# Patient Record
Sex: Male | Born: 1955 | Race: White | Hispanic: No | Marital: Single | Smoking: Former smoker
Health system: Southern US, Community
[De-identification: ages and names within clinical notes are randomized; demographics above are authoritative.]

## PROBLEM LIST (undated history)

## (undated) DIAGNOSIS — M545 Low back pain, unspecified: Secondary | ICD-10-CM

## (undated) DIAGNOSIS — M109 Gout, unspecified: Secondary | ICD-10-CM

## (undated) DIAGNOSIS — L819 Disorder of pigmentation, unspecified: Secondary | ICD-10-CM

## (undated) DIAGNOSIS — M1990 Unspecified osteoarthritis, unspecified site: Secondary | ICD-10-CM

## (undated) DIAGNOSIS — M519 Unspecified thoracic, thoracolumbar and lumbosacral intervertebral disc disorder: Secondary | ICD-10-CM

## (undated) DIAGNOSIS — G8929 Other chronic pain: Secondary | ICD-10-CM

## (undated) DIAGNOSIS — M5030 Other cervical disc degeneration, unspecified cervical region: Secondary | ICD-10-CM

## (undated) DIAGNOSIS — T8859XA Other complications of anesthesia, initial encounter: Secondary | ICD-10-CM

## (undated) DIAGNOSIS — T4145XA Adverse effect of unspecified anesthetic, initial encounter: Secondary | ICD-10-CM

## (undated) DIAGNOSIS — C4491 Basal cell carcinoma of skin, unspecified: Secondary | ICD-10-CM

## (undated) DIAGNOSIS — M5136 Other intervertebral disc degeneration, lumbar region: Secondary | ICD-10-CM

## (undated) DIAGNOSIS — M549 Dorsalgia, unspecified: Secondary | ICD-10-CM

## (undated) DIAGNOSIS — Z8619 Personal history of other infectious and parasitic diseases: Secondary | ICD-10-CM

## (undated) DIAGNOSIS — M51369 Other intervertebral disc degeneration, lumbar region without mention of lumbar back pain or lower extremity pain: Secondary | ICD-10-CM

## (undated) DIAGNOSIS — Z8489 Family history of other specified conditions: Secondary | ICD-10-CM

## (undated) DIAGNOSIS — J339 Nasal polyp, unspecified: Secondary | ICD-10-CM

## (undated) DIAGNOSIS — M169 Osteoarthritis of hip, unspecified: Secondary | ICD-10-CM

## (undated) HISTORY — PX: BACK SURGERY: SHX140

## (undated) HISTORY — PX: JOINT REPLACEMENT: SHX530

## (undated) HISTORY — PX: HERNIA REPAIR: SHX51

---

## 1989-08-21 HISTORY — PX: ANTERIOR CERVICAL DECOMP/DISCECTOMY FUSION: SHX1161

## 2002-08-21 HISTORY — PX: ABDOMINAL HERNIA REPAIR: SHX539

## 2016-06-27 ENCOUNTER — Ambulatory Visit (INDEPENDENT_AMBULATORY_CARE_PROVIDER_SITE_OTHER): Payer: BLUE CROSS/BLUE SHIELD | Admitting: Orthopaedic Surgery

## 2016-06-27 ENCOUNTER — Ambulatory Visit (INDEPENDENT_AMBULATORY_CARE_PROVIDER_SITE_OTHER): Payer: Self-pay

## 2016-06-27 DIAGNOSIS — M25552 Pain in left hip: Secondary | ICD-10-CM

## 2016-06-27 MED ORDER — DICLOFENAC SODIUM 75 MG PO TBEC: 75.0000 mg | DELAYED_RELEASE_TABLET | Freq: Two times a day (BID) | ORAL | 2 refills | Status: DC | PRN

## 2016-06-27 NOTE — Progress Notes (Signed)
   Office Visit Note   Patient: Danny Bender           Date of Birth: 1955-10-27           MRN: [id number] Visit Date: 06/27/2016              Requested by: [name], MD [address] [place], [place] [address] PCP: Henrine Screws, MD   Assessment & Plan: Visit Diagnoses:  1. Pain in left hip   2. Pain of left hip joint     Plan: I he was not inches any x-rays without trying medical treatment. He is only taken Aleve intermittently and on a regular basis. I'm Asendin some diclofenac to his pharmacy. I like to have him take this twice a day for at least a week followed by once a day for a week and then as needed. We'll see him back in about 3 or 4 weeks and see how he is doing overall. We still may not need x-rays lumbar spine and/or his hip and pelvis if he is not having any response to medications.  Follow-Up Instructions: No Follow-up on file.   Orders:  Orders Placed This Encounter  Procedures  . XR HIP UNILAT W OR W/O PELVIS 1V LEFT   Meds ordered this encounter  Medications  . diclofenac (VOLTAREN) 75 MG EC tablet    Sig: Take 1 tablet (75 mg total) by mouth 2 (two) times daily between meals as needed.    Dispense:  60 tablet    Refill:  2      Procedures: No procedures performed   Clinical Data: No additional findings.   Subjective: Chief Complaint  Patient presents with  . Left Hip - Pain    Around may he was cleaning out gutters going up and down ladder for a couple hours. Next day he had quite a bit of hip and groin pain.   He isn't having on and off intermittent pain in the groin for most of this year. He has tried anti-inflammatories which is just Aleve but not on a consistent basis at all. Dr. Geraldo Docker primary care physician did put him on 2 weeks of a steroid but this made him nauseous and quite sick. He is a very active individual who stands motion today. He is very flexible individual and as a regular exercise routine. There may  be a back component of his pain is well.  HPI  Review of Systems  Negative for chest pain, shortness of breath, fever, chills, nausea, vomiting Objective: Vital Signs: There were no vitals taken for this visit.  Physical Exam  Ortho Exam On examination he has a very lumbar lumbar spinal full flexion and extension. He has a negative straight leg raise bilaterally. He has some pain with internal/external rotation of his left hip. He is neurovascularly intact. Specialty Comments:  No specialty comments available.  Imaging: No results found. The patient deferred any x-rays today.  PMFS History: There are no active problems to display for this patient.  No past medical history on file.  No family history on file.  No past surgical history on file. Social History   Occupational History  . Not on file.   Social History Main Topics  . Smoking status: Not on file  . Smokeless tobacco: Not on file  . Alcohol use Not on file  . Drug use: Unknown  . Sexual activity: Not on file

## 2016-07-20 ENCOUNTER — Ambulatory Visit (INDEPENDENT_AMBULATORY_CARE_PROVIDER_SITE_OTHER): Payer: BLUE CROSS/BLUE SHIELD | Admitting: Orthopaedic Surgery

## 2016-07-20 DIAGNOSIS — M25552 Pain in left hip: Secondary | ICD-10-CM | POA: Diagnosis not present

## 2016-07-20 NOTE — Progress Notes (Signed)
The patient is doing much better after being started on anti-inflammatories. He still has some stiffness of his left hip but not the point where he wants anything done with it. I've had him on diclofenac twice a day.  On examination his left hip is considerably stiff limitations of internal rotation rotation as his right hip but isn't have a lot of pain with this at all.  At this point he'll follow up as needed. My next step with him would definitely be an AP pelvis and a lateral of his left hip if he comes back with hip pain. I do feel that he probably has significant arthritic hip but is not arm to the point of doing anything yet.

## 2016-10-18 ENCOUNTER — Other Ambulatory Visit (INDEPENDENT_AMBULATORY_CARE_PROVIDER_SITE_OTHER): Payer: Self-pay | Admitting: Orthopaedic Surgery

## 2016-10-20 ENCOUNTER — Other Ambulatory Visit (INDEPENDENT_AMBULATORY_CARE_PROVIDER_SITE_OTHER): Payer: Self-pay | Admitting: Orthopaedic Surgery

## 2017-04-18 ENCOUNTER — Telehealth (INDEPENDENT_AMBULATORY_CARE_PROVIDER_SITE_OTHER): Payer: Self-pay | Admitting: Orthopaedic Surgery

## 2017-04-18 NOTE — Telephone Encounter (Signed)
Do you know anything about this?? Or know anyone that would?

## 2017-04-18 NOTE — Telephone Encounter (Signed)
Patient called asked if Dr [name] is part of the [facility] Program. Patient said he was on the phone all day with Wayne. The number to contact patient is [phone]

## 2017-04-19 ENCOUNTER — Other Ambulatory Visit (INDEPENDENT_AMBULATORY_CARE_PROVIDER_SITE_OTHER): Payer: Self-pay | Admitting: Orthopaedic Surgery

## 2017-04-19 NOTE — Telephone Encounter (Signed)
See below

## 2017-04-19 NOTE — Telephone Encounter (Signed)
Forward to [employer].  She's familiar with this program.

## 2017-04-24 ENCOUNTER — Ambulatory Visit (INDEPENDENT_AMBULATORY_CARE_PROVIDER_SITE_OTHER): Payer: BLUE CROSS/BLUE SHIELD | Admitting: Orthopaedic Surgery

## 2017-04-24 ENCOUNTER — Ambulatory Visit (INDEPENDENT_AMBULATORY_CARE_PROVIDER_SITE_OTHER): Payer: BLUE CROSS/BLUE SHIELD

## 2017-04-24 DIAGNOSIS — M1612 Unilateral primary osteoarthritis, left hip: Secondary | ICD-10-CM | POA: Diagnosis not present

## 2017-04-24 DIAGNOSIS — M25552 Pain in left hip: Secondary | ICD-10-CM

## 2017-04-24 DIAGNOSIS — G8929 Other chronic pain: Secondary | ICD-10-CM | POA: Insufficient documentation

## 2017-04-24 NOTE — Progress Notes (Signed)
   Office Visit Note   Patient: Danny Bender           Date of Birth: 1955/12/20           MRN: [id number] Visit Date: 04/24/2017              Requested by: [name], MD [address] [place], [place] [address] PCP: [name], MD   Assessment & Plan: Visit Diagnoses:  1. Pain in left hip   2. Unilateral primary osteoarthritis, left hip     Plan: He does wish proceed with a total hip arthroplasty in December of this year. I agree with this at this point. He is tried and failed all forms conservative treatment over years now. His x-rays show significant severe osteophytes of the hip. With a long and thorough discussion about hip replacement surgery as well as a detailed discussion about the risk and benefits of the surgery. I talked about his intraoperative and postoperative course. I gave him a handout on hip replacement surgery and we went over his x-rays in detail as well as model showing what the surgery involves. All questions were encouraged and answered.  Follow-Up Instructions: Return for 2 weeks post-op.   Orders:  Orders Placed This Encounter  Procedures  . XR HIP UNILAT W OR W/O PELVIS 1V LEFT   No orders of the defined types were placed in this encounter.     Procedures: No procedures performed   Clinical Data: No additional findings.   Subjective: No chief complaint on file. The patient is someone I've seen for over a year now. He has severe debilitating arthritis of his left hip and is been well documented. He is tried over a year's worth of conservative treatment. His pain is daily and is detrimentally affected activities daily living, his quality life, and his mobility. At times it can be 10 out of 10 minutes all activity related. He said that hip is hurting in the groin and is significantly stiff at times.  HPI  Review of Systems   Objective: Vital Signs: There were no vitals taken for this visit.  Physical Exam He is alert  or 3 and in no acute distress. Ortho Exam His left hip is significantly stiff on exam with limited internal/external rotation. His very painful to move his hip around as well. His right hip exam is normal. Specialty Comments:  No specialty comments available.  Imaging: Xr Hip Unilat W Or W/o Pelvis 1v Left  Result Date: 04/24/2017 An AP pelvis and a lateral of his left hip show severe end-stage arthritis of left hip. There is complete loss of joint space. There are sclerotic changes in the acetabulum and the femoral head. There is also cystic changes in the femoral head and acetabulum. There periarticular osteophytes.    PMFS History: Patient Active Problem List   Diagnosis Date Noted  . Pain in left hip 04/24/2017  . Unilateral primary osteoarthritis, left hip 04/24/2017   No past medical history on file.  No family history on file.  No past surgical history on file. Social History   Occupational History  . Not on file.   Social History Main Topics  . Smoking status: Not on file  . Smokeless tobacco: Not on file  . Alcohol use Not on file  . Drug use: Unknown  . Sexual activity: Not on file

## 2017-04-24 NOTE — Progress Notes (Deleted)
Office Visit Note   Patient: Danny Bender           Date of Birth: 1956-02-25           MRN: [id number] Visit Date: 04/24/2017              Requested by: [name], MD [address] [place], [place] [address] PCP: [name], MD   Assessment & Plan: Visit Diagnoses:  1. Pain in left hip   2. Unilateral primary osteoarthritis, left hip     Plan: ***  Follow-Up Instructions: No Follow-up on file.   Orders:  Orders Placed This Encounter  Procedures  . XR HIP UNILAT W OR W/O PELVIS 1V LEFT   No orders of the defined types were placed in this encounter.     Procedures: No procedures performed   Clinical Data: No additional findings.   Subjective: No chief complaint on file.   HPI  Review of Systems   Objective: Vital Signs: There were no vitals taken for this visit.  Physical Exam  Ortho Exam  Specialty Comments:  No specialty comments available.  Imaging: No results found.   PMFS History: Patient Active Problem List   Diagnosis Date Noted  . Pain in left hip 04/24/2017  . Unilateral primary osteoarthritis, left hip 04/24/2017   No past medical history on file.  No family history on file.  No past surgical history on file. Social History   Occupational History  . Not on file.   Social History Main Topics  . Smoking status: Not on file  . Smokeless tobacco: Not on file  . Alcohol use Not on file  . Drug use: Unknown  . Sexual activity: Not on file

## 2017-05-31 NOTE — Telephone Encounter (Signed)
Spoke with patient and scheduled surgery. °

## 2017-07-22 ENCOUNTER — Other Ambulatory Visit (INDEPENDENT_AMBULATORY_CARE_PROVIDER_SITE_OTHER): Payer: Self-pay | Admitting: Orthopaedic Surgery

## 2017-07-25 ENCOUNTER — Other Ambulatory Visit (INDEPENDENT_AMBULATORY_CARE_PROVIDER_SITE_OTHER): Payer: Self-pay | Admitting: Physician Assistant

## 2017-08-03 ENCOUNTER — Encounter (HOSPITAL_COMMUNITY): Payer: Self-pay

## 2017-08-03 NOTE — Patient Instructions (Signed)
Danny Bender  08/03/2017   Your procedure is scheduled on: Friday, Dec. 21, 2018   Report to [facility] Main  Entrance    Report to admitting at 8:30 AM   Call this number if you have problems the morning of surgery [phone]   Do not eat food or drink liquids :After Midnight.     Take these medicines the morning of surgery with A SIP OF WATER: Use Flonase per normal routine                               You may not have any metal on your body including jewelry and body  piercings               Do not wear lotions, powders, perfumes, or deodorant                          Men may shave face and neck.   Do not bring valuables to the hospital. [place].   Contacts, dentures or bridgework may not be worn into surgery.   Leave suitcase in the car. After surgery it may be brought to your room.              Please read over the following fact sheets you were given: _____________________________________________________________________        [facility] - Preparing for Surgery Before surgery, you can play an important role.  Because skin is not sterile, your skin needs to be as free of germs as possible.  You can reduce the number of germs on your skin by washing with CHG (chlorahexidine gluconate) soap before surgery.  CHG is an antiseptic cleaner which kills germs and bonds with the skin to continue killing germs even after washing. Please DO NOT use if you have an allergy to CHG or antibacterial soaps.  If your skin becomes reddened/irritated stop using the CHG and inform your nurse when you arrive at Short Stay. Do not shave (including legs and underarms) for at least 48 hours prior to the first CHG shower.  You may shave your face/neck.  Please follow these instructions carefully:  1.  Shower with CHG Soap the night before surgery and the  morning of surgery.  2.  If you choose to wash your hair,  wash your hair first as usual with your normal  shampoo.  3.  After you shampoo, rinse your hair and body thoroughly to remove the shampoo.                             4.  Use CHG as you would any other liquid soap.  You can apply chg directly to the skin and wash.  Gently with a scrungie or clean washcloth.  5.  Apply the CHG Soap to your body ONLY FROM THE NECK DOWN.   Do   not use on face/ open                           Wound or open sores. Avoid contact with eyes, ears mouth and   genitals (private parts).  Wash face,  Genitals (private parts) with your normal soap.             6.  Wash thoroughly, paying special attention to the area where your    surgery  will be performed.  7.  Thoroughly rinse your body with warm water from the neck down.  8.  DO NOT shower/wash with your normal soap after using and rinsing off the CHG Soap.                9.  Pat yourself dry with a clean towel.            10.  Wear clean pajamas.            11.  Place clean sheets on your bed the night of your first shower and do not  sleep with pets. Day of Surgery : Do not apply any lotions/deodorants the morning of surgery.  Please wear clean clothes to the hospital/surgery center.  FAILURE TO FOLLOW THESE INSTRUCTIONS MAY RESULT IN THE CANCELLATION OF YOUR SURGERY  PATIENT SIGNATURE_________________________________  NURSE SIGNATURE__________________________________  ________________________________________________________________________   Danny Bender  An incentive spirometer is a tool that can help keep your lungs clear and active. This tool measures how well you are filling your lungs with each breath. Taking long deep breaths may help reverse or decrease the chance of developing breathing (pulmonary) problems (especially infection) following:  A long period of time when you are unable to move or be active. BEFORE THE PROCEDURE   If the spirometer includes an indicator to  show your best effort, your nurse or respiratory therapist will set it to a desired goal.  If possible, sit up straight or lean slightly forward. Try not to slouch.  Hold the incentive spirometer in an upright position. INSTRUCTIONS FOR USE  1. Sit on the edge of your bed if possible, or sit up as far as you can in bed or on a chair. 2. Hold the incentive spirometer in an upright position. 3. Breathe out normally. 4. Place the mouthpiece in your mouth and seal your lips tightly around it. 5. Breathe in slowly and as deeply as possible, raising the piston or the ball toward the top of the column. 6. Hold your breath for 3-5 seconds or for as long as possible. Allow the piston or ball to fall to the bottom of the column. 7. Remove the mouthpiece from your mouth and breathe out normally. 8. Rest for a few seconds and repeat Steps 1 through 7 at least 10 times every 1-2 hours when you are awake. Take your time and take a few normal breaths between deep breaths. 9. The spirometer may include an indicator to show your best effort. Use the indicator as a goal to work toward during each repetition. 10. After each set of 10 deep breaths, practice coughing to be sure your lungs are clear. If you have an incision (the cut made at the time of surgery), support your incision when coughing by placing a pillow or rolled up towels firmly against it. Once you are able to get out of bed, walk around indoors and cough well. You may stop using the incentive spirometer when instructed by your caregiver.  RISKS AND COMPLICATIONS  Take your time so you do not get dizzy or light-headed.  If you are in pain, you may need to take or ask for pain medication before doing incentive spirometry. It is harder to take a deep breath if you  are having pain. AFTER USE  Rest and breathe slowly and easily.  It can be helpful to keep track of a log of your progress. Your caregiver can provide you with a simple table to help with  this. If you are using the spirometer at home, follow these instructions: [place] IF:   You are having difficultly using the spirometer.  You have trouble using the spirometer as often as instructed.  Your pain medication is not giving enough relief while using the spirometer.  You develop fever of 100.5 F (38.1 C) or higher. SEEK IMMEDIATE MEDICAL CARE IF:   You cough up bloody sputum that had not been present before.  You develop fever of 102 F (38.9 C) or greater.  You develop worsening pain at or near the incision site. MAKE SURE YOU:   Understand these instructions.  Will watch your condition.  Will get help right away if you are not doing well or get worse. Document Released: 12/18/2006 Document Revised: 10/30/2011 Document Reviewed: 02/18/2007 ExitCare Patient Information 2014 ExitCare, [place].   ________________________________________________________________________  WHAT IS A BLOOD TRANSFUSION? Blood Transfusion Information  A transfusion is the replacement of blood or some of its parts. Blood is made up of multiple cells which provide different functions.  Red blood cells carry oxygen and are used for blood loss replacement.  White blood cells fight against infection.  Platelets control bleeding.  Plasma helps clot blood.  Other blood products are available for specialized needs, such as hemophilia or other clotting disorders. BEFORE THE TRANSFUSION  Who gives blood for transfusions?   Healthy volunteers who are fully evaluated to make sure their blood is safe. This is blood bank blood. Transfusion therapy is the safest it has ever been in the practice of medicine. Before blood is taken from a donor, a complete history is taken to make sure that person has no history of diseases nor engages in risky social behavior (examples are intravenous drug use or sexual activity with multiple partners). The donor's travel history is screened to minimize  risk of transmitting infections, such as malaria. The donated blood is tested for signs of infectious diseases, such as HIV and hepatitis. The blood is then tested to be sure it is compatible with you in order to minimize the chance of a transfusion reaction. If you or a relative donates blood, this is often done in anticipation of surgery and is not appropriate for emergency situations. It takes many days to process the donated blood. RISKS AND COMPLICATIONS Although transfusion therapy is very safe and saves many lives, the main dangers of transfusion include:   Getting an infectious disease.  Developing a transfusion reaction. This is an allergic reaction to something in the blood you were given. Every precaution is taken to prevent this. The decision to have a blood transfusion has been considered carefully by your caregiver before blood is given. Blood is not given unless the benefits outweigh the risks. AFTER THE TRANSFUSION  Right after receiving a blood transfusion, you will usually feel much better and more energetic. This is especially true if your red blood cells have gotten low (anemic). The transfusion raises the level of the red blood cells which carry oxygen, and this usually causes an energy increase.  The nurse administering the transfusion will monitor you carefully for complications. HOME CARE INSTRUCTIONS  No special instructions are needed after a transfusion. You may find your energy is better. Speak with your caregiver about any limitations on activity  for underlying diseases you may have. SEEK MEDICAL CARE IF:   Your condition is not improving after your transfusion.  You develop redness or irritation at the intravenous (IV) site. SEEK IMMEDIATE MEDICAL CARE IF:  Any of the following symptoms occur over the next 12 hours:  Shaking chills.  You have a temperature by mouth above 102 F (38.9 C), not controlled by medicine.  Chest, back, or muscle pain.  People  around you feel you are not acting correctly or are confused.  Shortness of breath or difficulty breathing.  Dizziness and fainting.  You get a rash or develop hives.  You have a decrease in urine output.  Your urine turns a dark color or changes to pink, red, or brown. Any of the following symptoms occur over the next 10 days:  You have a temperature by mouth above 102 F (38.9 C), not controlled by medicine.  Shortness of breath.  Weakness after normal activity.  The white part of the eye turns yellow (jaundice).  You have a decrease in the amount of urine or are urinating less often.  Your urine turns a dark color or changes to pink, red, or brown. Document Released: 08/04/2000 Document Revised: 10/30/2011 Document Reviewed: 03/23/2008 [facility] Patient Information 2014 [place], [place].  _______________________________________________________________________

## 2017-08-03 NOTE — Pre-Procedure Instructions (Signed)
Last office visit note from Dr. [name] 04/24/17 in epic.

## 2017-08-06 ENCOUNTER — Encounter (INDEPENDENT_AMBULATORY_CARE_PROVIDER_SITE_OTHER): Payer: Self-pay

## 2017-08-06 ENCOUNTER — Encounter (HOSPITAL_COMMUNITY): Payer: Self-pay

## 2017-08-06 ENCOUNTER — Other Ambulatory Visit: Payer: Self-pay

## 2017-08-06 ENCOUNTER — Encounter (HOSPITAL_COMMUNITY)
Admission: RE | Admit: 2017-08-06 | Discharge: 2017-08-06 | Disposition: A | Discharge status: Home / Self Care | Payer: BLUE CROSS/BLUE SHIELD | Source: Ambulatory Visit | Attending: Orthopaedic Surgery | Admitting: Orthopaedic Surgery

## 2017-08-06 DIAGNOSIS — M1612 Unilateral primary osteoarthritis, left hip: Secondary | ICD-10-CM | POA: Insufficient documentation

## 2017-08-06 DIAGNOSIS — Z01812 Encounter for preprocedural laboratory examination: Secondary | ICD-10-CM | POA: Insufficient documentation

## 2017-08-06 HISTORY — DX: Personal history of other infectious and parasitic diseases: Z86.19

## 2017-08-06 HISTORY — DX: Other cervical disc degeneration, unspecified cervical region: M50.30

## 2017-08-06 HISTORY — DX: Adverse effect of unspecified anesthetic, initial encounter: T41.45XA

## 2017-08-06 HISTORY — DX: Osteoarthritis of hip, unspecified: M16.9

## 2017-08-06 HISTORY — DX: Other chronic pain: G89.29

## 2017-08-06 HISTORY — DX: Other intervertebral disc degeneration, lumbar region: M51.36

## 2017-08-06 HISTORY — DX: Other intervertebral disc degeneration, lumbar region without mention of lumbar back pain or lower extremity pain: M51.369

## 2017-08-06 HISTORY — DX: Nasal polyp, unspecified: J33.9

## 2017-08-06 HISTORY — DX: Disorder of pigmentation, unspecified: L81.9

## 2017-08-06 HISTORY — DX: Unspecified thoracic, thoracolumbar and lumbosacral intervertebral disc disorder: M51.9

## 2017-08-06 HISTORY — DX: Other complications of anesthesia, initial encounter: T88.59XA

## 2017-08-06 HISTORY — DX: Dorsalgia, unspecified: M54.9

## 2017-08-06 LAB — CBC
HEMATOCRIT: 43.1 % (ref 39.0–52.0)
HEMOGLOBIN: 14.4 g/dL (ref 13.0–17.0)
MCH: 32.4 pg (ref 26.0–34.0)
MCHC: 33.4 g/dL (ref 30.0–36.0)
MCV: 97.1 fL (ref 78.0–100.0)
Platelets: 232 10*3/uL (ref 150–400)
RBC: 4.44 MIL/uL (ref 4.22–5.81)
RDW: 13 % (ref 11.5–15.5)
WBC: 5 10*3/uL (ref 4.0–10.5)

## 2017-08-06 LAB — SURGICAL PCR SCREEN
MRSA, PCR: NEGATIVE
STAPHYLOCOCCUS AUREUS: NEGATIVE

## 2017-08-07 LAB — ABO/RH: ABO/RH(D): O POS

## 2017-08-10 ENCOUNTER — Encounter (HOSPITAL_COMMUNITY)
Admission: RE | Disposition: A | Discharge status: Home Health | Payer: Self-pay | Source: Ambulatory Visit | Attending: Orthopaedic Surgery

## 2017-08-10 ENCOUNTER — Inpatient Hospital Stay (HOSPITAL_COMMUNITY): Payer: BLUE CROSS/BLUE SHIELD

## 2017-08-10 ENCOUNTER — Encounter (HOSPITAL_COMMUNITY): Payer: Self-pay | Admitting: *Deleted

## 2017-08-10 ENCOUNTER — Other Ambulatory Visit (INDEPENDENT_AMBULATORY_CARE_PROVIDER_SITE_OTHER): Payer: Self-pay

## 2017-08-10 ENCOUNTER — Inpatient Hospital Stay (HOSPITAL_COMMUNITY)
Admission: RE | Admit: 2017-08-10 | Discharge: 2017-08-11 | DRG: 470 | Disposition: A | Discharge status: Home Health | Payer: BLUE CROSS/BLUE SHIELD | Source: Ambulatory Visit | Attending: Orthopaedic Surgery | Admitting: Orthopaedic Surgery

## 2017-08-10 ENCOUNTER — Inpatient Hospital Stay (HOSPITAL_COMMUNITY): Payer: BLUE CROSS/BLUE SHIELD | Admitting: Anesthesiology

## 2017-08-10 DIAGNOSIS — M25552 Pain in left hip: Secondary | ICD-10-CM | POA: Diagnosis present

## 2017-08-10 DIAGNOSIS — Z85828 Personal history of other malignant neoplasm of skin: Secondary | ICD-10-CM

## 2017-08-10 DIAGNOSIS — Z981 Arthrodesis status: Secondary | ICD-10-CM

## 2017-08-10 DIAGNOSIS — Z419 Encounter for procedure for purposes other than remedying health state, unspecified: Secondary | ICD-10-CM

## 2017-08-10 DIAGNOSIS — Z96642 Presence of left artificial hip joint: Secondary | ICD-10-CM

## 2017-08-10 DIAGNOSIS — M1612 Unilateral primary osteoarthritis, left hip: Principal | ICD-10-CM | POA: Diagnosis present

## 2017-08-10 HISTORY — PX: TOTAL HIP ARTHROPLASTY: SHX124

## 2017-08-10 LAB — TYPE AND SCREEN
ABO/RH(D): O POS
Antibody Screen: NEGATIVE

## 2017-08-10 IMAGING — DX DG PORTABLE PELVIS
1 series · 1 of 1 positions shown · non-contrast
Comparison: Operative images obtained earlier today.

CLINICAL DATA: Status post left hip arthroplasty.

EXAM:
PORTABLE PELVIS 1-2 VIEWS

[pelvis ap]
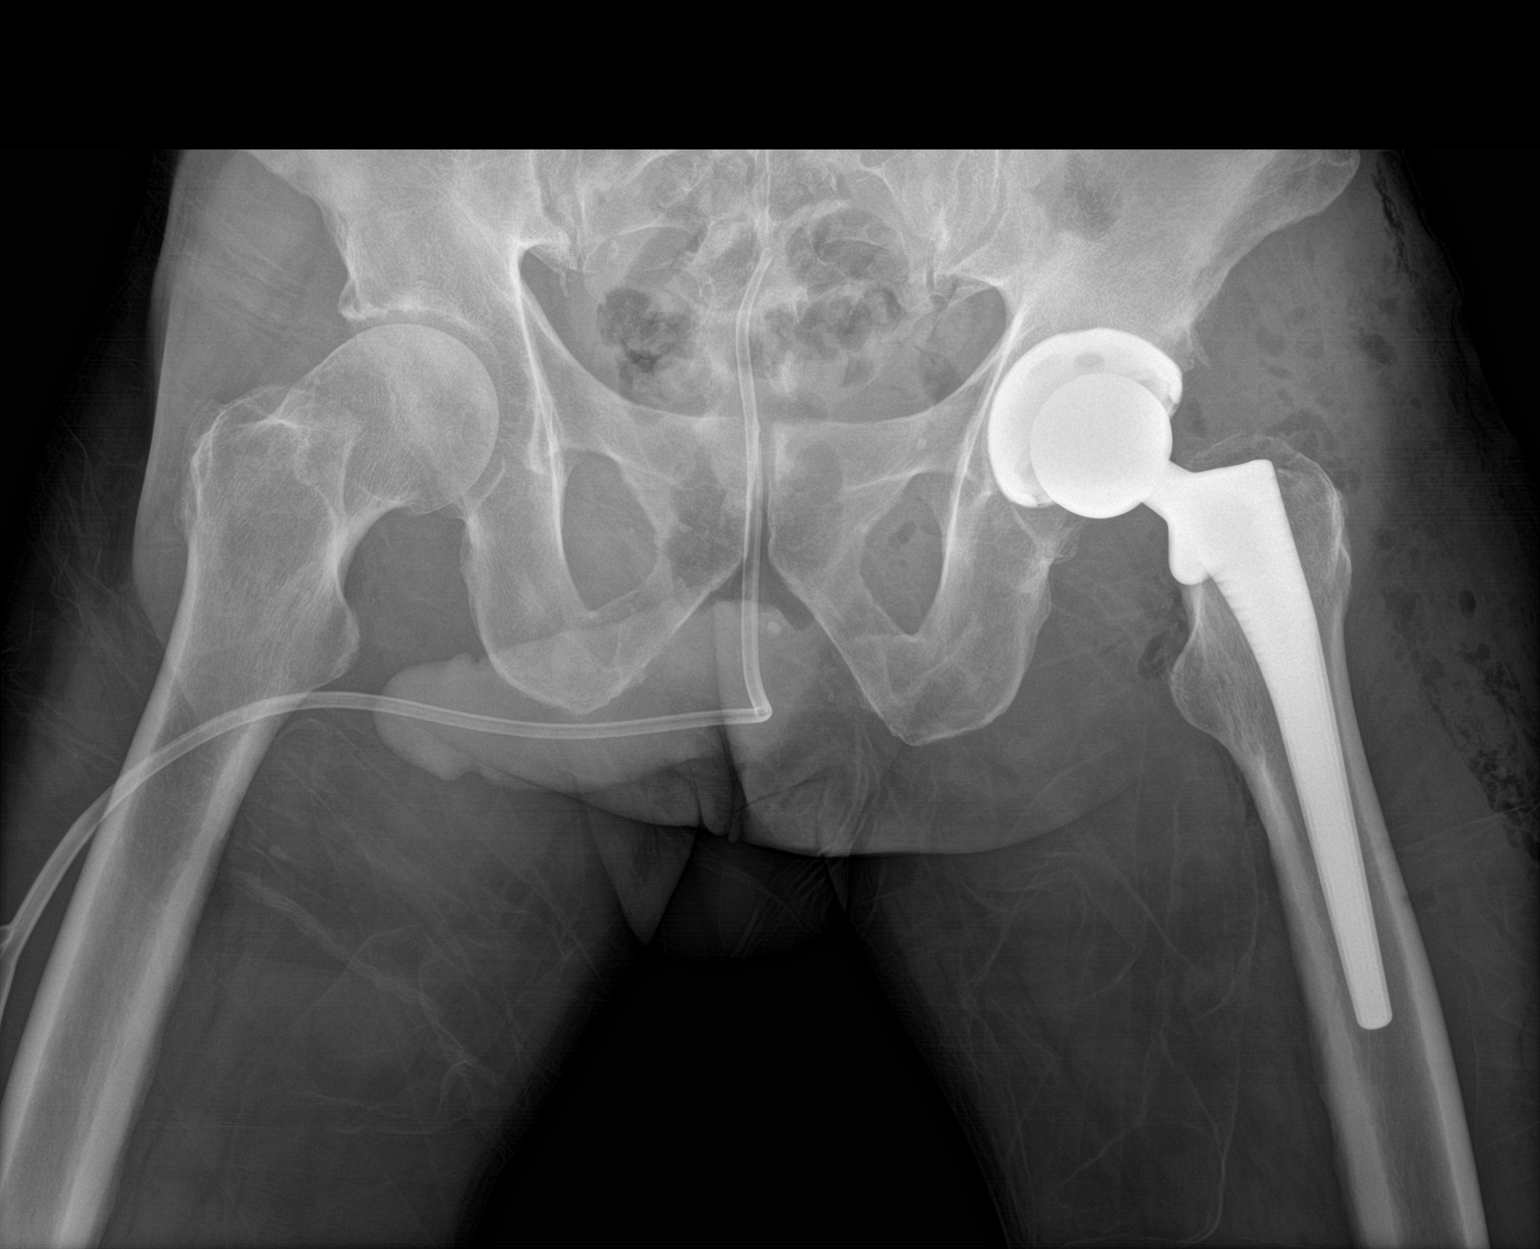

[1 of 1 positions shown; findings below may reference images not displayed]

FINDINGS: Total left hip arthroplasty components appear well seated and well
aligned on the single AP view. There is no acute fracture or
evidence of an operative complication.
IMPRESSION: Well-positioned left hip total arthroplasty.

## 2017-08-10 IMAGING — RF DG HIP (WITH PELVIS) OPERATIVE*L*
1 series · 2 of 2 positions shown · non-contrast
Comparison: None.

CLINICAL DATA: Intra op left side anterior approach total hip
replacement. Hx of OA.

EXAM:
OPERATIVE left HIP (WITH PELVIS IF PERFORMED) 2 VIEWS
TECHNIQUE: Fluoroscopic spot image(s) were submitted for interpretation
post-operatively.

[Series 1: run · 2 of 2 slices shown]
[im 1/2]
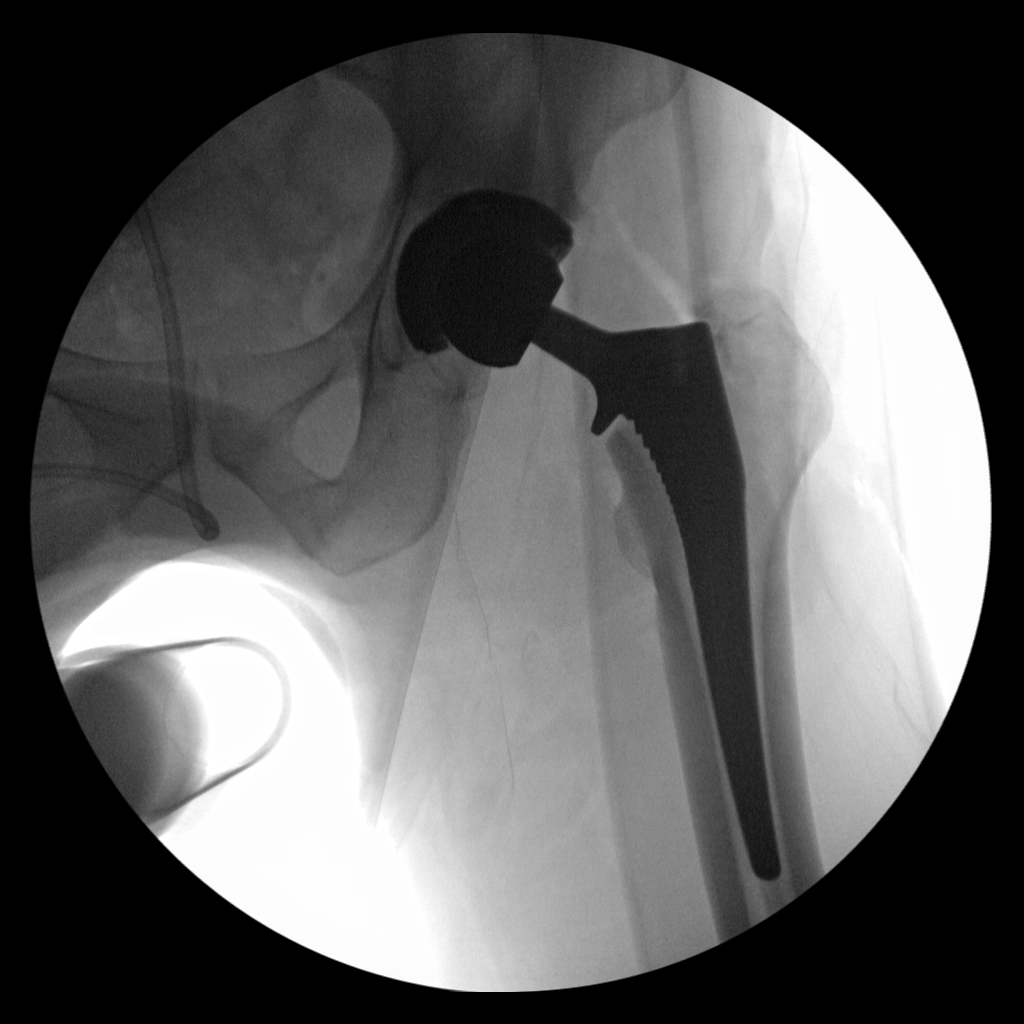
[im 2/2]
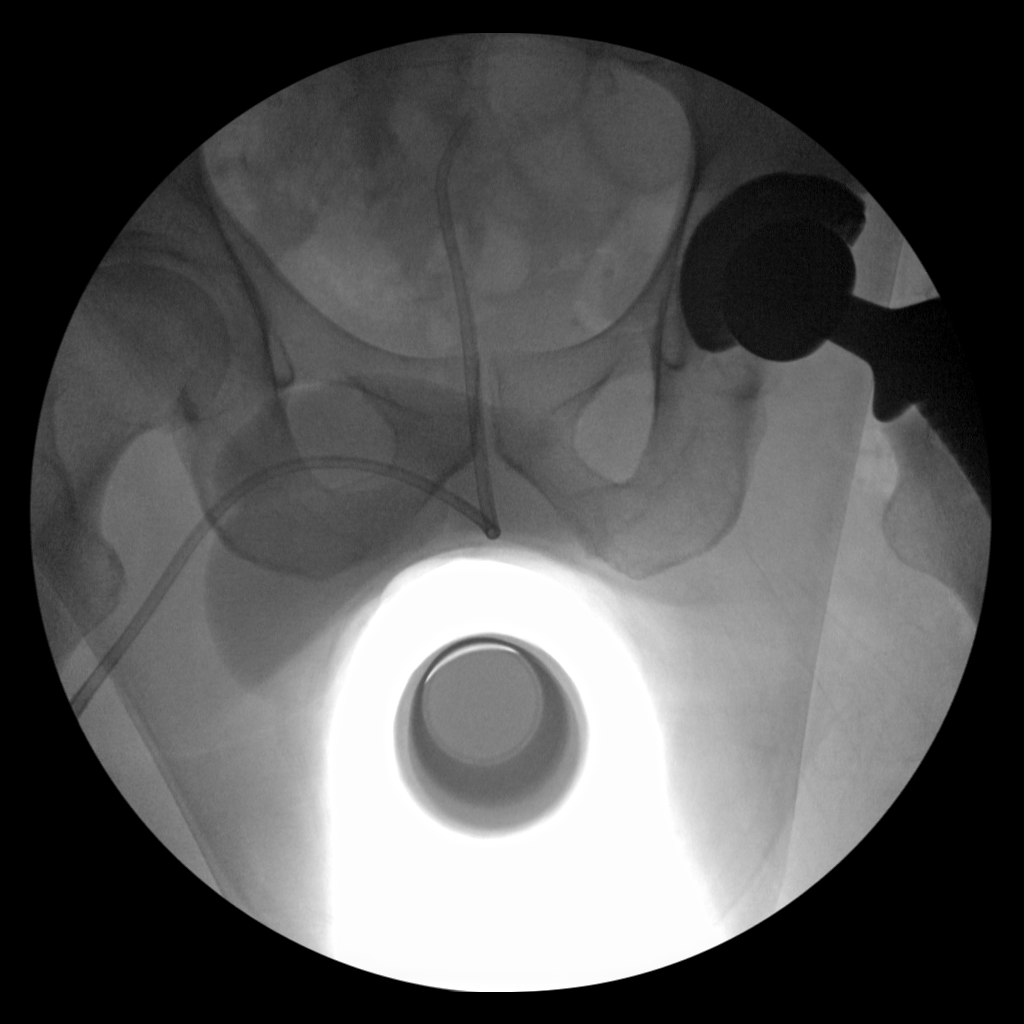

[2 of 2 positions shown; findings below may reference images not displayed]

FINDINGS: Two intraoperative fluoroscopic images are provided. Left hip
arthroplasty hardware appears intact and appropriately positioned.
Adjacent osseous structures appear anatomic in alignment.

Fluoroscopy provided for 38 seconds.  2.78 mGy
IMPRESSION: Intraoperative fluoroscopic images demonstrating left hip
arthroplasty. Hardware appears intact and appropriately positioned.
No evidence of surgical complicating feature.

## 2017-08-10 SURGERY — ARTHROPLASTY, HIP, TOTAL, ANTERIOR APPROACH
Anesthesia: General | Laterality: Left

## 2017-08-10 MED ORDER — METHOCARBAMOL 1000 MG/10ML IJ SOLN
500.0000 mg | Freq: Four times a day (QID) | INTRAVENOUS | Status: DC | PRN
  Filled 2017-08-10: qty 5

## 2017-08-10 MED ORDER — OXYCODONE HCL 5 MG PO TABS
10.0000 mg | ORAL_TABLET | ORAL | Status: DC | PRN
  Administered 2017-08-10: 10 mg via ORAL

## 2017-08-10 MED ORDER — FENTANYL CITRATE (PF) 100 MCG/2ML IJ SOLN
25.0000 ug | INTRAMUSCULAR | Status: DC | PRN
  Administered 2017-08-10: 25 ug via INTRAVENOUS

## 2017-08-10 MED ORDER — FENTANYL CITRATE (PF) 100 MCG/2ML IJ SOLN: 25.0000 ug | INTRAMUSCULAR | Status: DC | PRN

## 2017-08-10 MED ORDER — HYDROMORPHONE HCL 1 MG/ML IJ SOLN: 1.0000 mg | INTRAMUSCULAR | Status: DC | PRN

## 2017-08-10 MED ORDER — ONDANSETRON HCL 4 MG/2ML IJ SOLN: 4.0000 mg | Freq: Once | INTRAMUSCULAR | Status: DC | PRN

## 2017-08-10 MED ORDER — POLYETHYLENE GLYCOL 3350 17 G PO PACK: 17.0000 g | PACK | Freq: Every day | ORAL | Status: DC | PRN

## 2017-08-10 MED ORDER — OXYCODONE HCL 5 MG/5ML PO SOLN
5.0000 mg | Freq: Once | ORAL | Status: DC | PRN
  Filled 2017-08-10: qty 5

## 2017-08-10 MED ORDER — VITAMIN B-6 50 MG PO TABS
50.0000 mg | ORAL_TABLET | Freq: Every day | ORAL | Status: DC
  Administered 2017-08-11: 50 mg via ORAL
  Filled 2017-08-10: qty 1

## 2017-08-10 MED ORDER — OXYCODONE HCL 5 MG PO TABS: 5.0000 mg | ORAL_TABLET | Freq: Once | ORAL | Status: DC | PRN

## 2017-08-10 MED ORDER — SODIUM CHLORIDE 0.9 % IR SOLN
Status: DC | PRN
  Administered 2017-08-10: 1000 mL

## 2017-08-10 MED ORDER — ZOLPIDEM TARTRATE 5 MG PO TABS
5.0000 mg | ORAL_TABLET | Freq: Every evening | ORAL | Status: DC | PRN
  Administered 2017-08-11: 5 mg via ORAL
  Filled 2017-08-10: qty 1

## 2017-08-10 MED ORDER — FENTANYL CITRATE (PF) 100 MCG/2ML IJ SOLN
INTRAMUSCULAR | Status: AC
  Filled 2017-08-10: qty 2

## 2017-08-10 MED ORDER — OXYCODONE HCL 5 MG PO TABS
ORAL_TABLET | ORAL | Status: AC
  Filled 2017-08-10: qty 2

## 2017-08-10 MED ORDER — EPHEDRINE 5 MG/ML INJ
INTRAVENOUS | Status: AC
  Filled 2017-08-10: qty 10

## 2017-08-10 MED ORDER — PROPOFOL 10 MG/ML IV BOLUS
INTRAVENOUS | Status: AC
  Filled 2017-08-10: qty 60

## 2017-08-10 MED ORDER — ALUM & MAG HYDROXIDE-SIMETH 200-200-20 MG/5ML PO SUSP: 30.0000 mL | ORAL | Status: DC | PRN

## 2017-08-10 MED ORDER — METHOCARBAMOL 500 MG PO TABS: 500.0000 mg | ORAL_TABLET | Freq: Four times a day (QID) | ORAL | Status: DC | PRN

## 2017-08-10 MED ORDER — ONDANSETRON HCL 4 MG/2ML IJ SOLN
INTRAMUSCULAR | Status: AC
  Filled 2017-08-10: qty 2

## 2017-08-10 MED ORDER — ACETAMINOPHEN 650 MG RE SUPP
650.0000 mg | RECTAL | Status: DC | PRN
Start: 2017-08-10 — End: 2017-08-11

## 2017-08-10 MED ORDER — LACTATED RINGERS IV SOLN
INTRAVENOUS | Status: DC
  Administered 2017-08-10 (×2): via INTRAVENOUS
  Administered 2017-08-10: 1000 mL via INTRAVENOUS

## 2017-08-10 MED ORDER — CEFAZOLIN SODIUM-DEXTROSE 2-4 GM/100ML-% IV SOLN
INTRAVENOUS | Status: AC
  Filled 2017-08-10: qty 100

## 2017-08-10 MED ORDER — DOCUSATE SODIUM 100 MG PO CAPS
100.0000 mg | ORAL_CAPSULE | Freq: Two times a day (BID) | ORAL | Status: DC
  Administered 2017-08-10 – 2017-08-11 (×2): 100 mg via ORAL
  Filled 2017-08-10 (×2): qty 1

## 2017-08-10 MED ORDER — DEXAMETHASONE SODIUM PHOSPHATE 10 MG/ML IJ SOLN
INTRAMUSCULAR | Status: AC
  Filled 2017-08-10: qty 1

## 2017-08-10 MED ORDER — DEXAMETHASONE SODIUM PHOSPHATE 10 MG/ML IJ SOLN
INTRAMUSCULAR | Status: DC | PRN
  Administered 2017-08-10: 10 mg via INTRAVENOUS

## 2017-08-10 MED ORDER — PROPOFOL 500 MG/50ML IV EMUL
INTRAVENOUS | Status: DC | PRN
  Administered 2017-08-10: 75 ug/kg/min via INTRAVENOUS

## 2017-08-10 MED ORDER — PHENOL 1.4 % MT LIQD: 1.0000 | OROMUCOSAL | Status: DC | PRN

## 2017-08-10 MED ORDER — CEFAZOLIN SODIUM-DEXTROSE 2-4 GM/100ML-% IV SOLN
2.0000 g | INTRAVENOUS | Status: AC
  Administered 2017-08-10: 2 g via INTRAVENOUS

## 2017-08-10 MED ORDER — DIPHENHYDRAMINE HCL 12.5 MG/5ML PO ELIX
12.5000 mg | ORAL_SOLUTION | ORAL | Status: DC | PRN
Start: 2017-08-10 — End: 2017-08-11

## 2017-08-10 MED ORDER — PROPOFOL 10 MG/ML IV BOLUS
INTRAVENOUS | Status: DC | PRN
  Administered 2017-08-10: 30 mg via INTRAVENOUS
  Administered 2017-08-10 (×2): 20 mg via INTRAVENOUS

## 2017-08-10 MED ORDER — TRANEXAMIC ACID 1000 MG/10ML IV SOLN
1000.0000 mg | INTRAVENOUS | Status: AC
  Administered 2017-08-10: 1000 mg via INTRAVENOUS
  Filled 2017-08-10: qty 1100

## 2017-08-10 MED ORDER — CHLORHEXIDINE GLUCONATE 4 % EX LIQD: 60.0000 mL | Freq: Once | CUTANEOUS | Status: DC

## 2017-08-10 MED ORDER — MIDAZOLAM HCL 2 MG/2ML IJ SOLN
INTRAMUSCULAR | Status: AC
  Filled 2017-08-10: qty 2

## 2017-08-10 MED ORDER — ONDANSETRON HCL 4 MG/2ML IJ SOLN: 4.0000 mg | Freq: Four times a day (QID) | INTRAMUSCULAR | Status: DC | PRN

## 2017-08-10 MED ORDER — SODIUM CHLORIDE 0.9 % IV SOLN
INTRAVENOUS | Status: DC
  Administered 2017-08-10: 20:00:00 via INTRAVENOUS

## 2017-08-10 MED ORDER — MENTHOL 3 MG MT LOZG: 1.0000 | LOZENGE | OROMUCOSAL | Status: DC | PRN

## 2017-08-10 MED ORDER — VITAMIN B-12 1000 MCG PO TABS
1000.0000 ug | ORAL_TABLET | Freq: Every day | ORAL | Status: DC
  Administered 2017-08-11: 1000 ug via ORAL
  Filled 2017-08-10: qty 1

## 2017-08-10 MED ORDER — EPHEDRINE SULFATE-NACL 50-0.9 MG/10ML-% IV SOSY
PREFILLED_SYRINGE | INTRAVENOUS | Status: DC | PRN
  Administered 2017-08-10: 5 mg via INTRAVENOUS

## 2017-08-10 MED ORDER — HYDROCODONE-ACETAMINOPHEN 5-325 MG PO TABS
1.0000 | ORAL_TABLET | ORAL | Status: DC | PRN
  Administered 2017-08-10 – 2017-08-11 (×3): 1 via ORAL
  Filled 2017-08-10 (×3): qty 1

## 2017-08-10 MED ORDER — ONDANSETRON HCL 4 MG PO TABS: 4.0000 mg | ORAL_TABLET | Freq: Four times a day (QID) | ORAL | Status: DC | PRN

## 2017-08-10 MED ORDER — BUPIVACAINE IN DEXTROSE 0.75-8.25 % IT SOLN
INTRATHECAL | Status: DC | PRN
  Administered 2017-08-10: 2 mL via INTRATHECAL

## 2017-08-10 MED ORDER — FLUTICASONE PROPIONATE 50 MCG/ACT NA SUSP
1.0000 | Freq: Every day | NASAL | Status: DC
  Administered 2017-08-11: 1 via NASAL
  Filled 2017-08-10: qty 16

## 2017-08-10 MED ORDER — METOCLOPRAMIDE HCL 5 MG/ML IJ SOLN
5.0000 mg | Freq: Three times a day (TID) | INTRAMUSCULAR | Status: DC | PRN
Start: 2017-08-10 — End: 2017-08-11

## 2017-08-10 MED ORDER — METOCLOPRAMIDE HCL 5 MG PO TABS
5.0000 mg | ORAL_TABLET | Freq: Three times a day (TID) | ORAL | Status: DC | PRN
Start: 2017-08-10 — End: 2017-08-11

## 2017-08-10 MED ORDER — HYDROMORPHONE HCL 1 MG/ML IJ SOLN: 0.2500 mg | INTRAMUSCULAR | Status: DC | PRN

## 2017-08-10 MED ORDER — CEFAZOLIN SODIUM-DEXTROSE 1-4 GM/50ML-% IV SOLN
1.0000 g | Freq: Four times a day (QID) | INTRAVENOUS | Status: AC
  Administered 2017-08-10: 1 g via INTRAVENOUS
  Filled 2017-08-10 (×2): qty 50

## 2017-08-10 MED ORDER — ASPIRIN EC 325 MG PO TBEC
325.0000 mg | DELAYED_RELEASE_TABLET | Freq: Every day | ORAL | Status: DC
  Administered 2017-08-11: 325 mg via ORAL
  Filled 2017-08-10: qty 1

## 2017-08-10 MED ORDER — ACETAMINOPHEN 325 MG PO TABS: 650.0000 mg | ORAL_TABLET | ORAL | Status: DC | PRN

## 2017-08-10 SURGICAL SUPPLY — 36 items
BAG ZIPLOCK 12X15 (MISCELLANEOUS) IMPLANT
BENZOIN TINCTURE PRP APPL 2/3 (GAUZE/BANDAGES/DRESSINGS) IMPLANT
BLADE SAW SGTL 18X1.27X75 (BLADE) ×2 IMPLANT
CAPT HIP TOTAL 2 ×2 IMPLANT
CELLS DAT CNTRL 66122 CELL SVR (MISCELLANEOUS) IMPLANT
COVER PERINEAL POST (MISCELLANEOUS) ×2 IMPLANT
COVER SURGICAL LIGHT HANDLE (MISCELLANEOUS) ×2 IMPLANT
DRAPE STERI IOBAN 125X83 (DRAPES) ×2 IMPLANT
DRAPE U-SHAPE 47X51 STRL (DRAPES) ×4 IMPLANT
DRESSING AQUACEL AG SP 3.5X10 (GAUZE/BANDAGES/DRESSINGS) ×1 IMPLANT
DRSG ADAPTIC 3X8 NADH LF (GAUZE/BANDAGES/DRESSINGS) ×2 IMPLANT
DRSG AQUACEL AG ADV 3.5X10 (GAUZE/BANDAGES/DRESSINGS) ×2 IMPLANT
DRSG AQUACEL AG SP 3.5X10 (GAUZE/BANDAGES/DRESSINGS) ×2
DURAPREP 26ML APPLICATOR (WOUND CARE) ×2 IMPLANT
ELECT REM PT RETURN 15FT ADLT (MISCELLANEOUS) ×2 IMPLANT
GAUZE XEROFORM 1X8 LF (GAUZE/BANDAGES/DRESSINGS) IMPLANT
GLOVE BIO SURGEON STRL SZ7.5 (GLOVE) ×2 IMPLANT
GLOVE BIOGEL PI IND STRL 8 (GLOVE) ×2 IMPLANT
GLOVE BIOGEL PI INDICATOR 8 (GLOVE) ×2
GLOVE ECLIPSE 8.0 STRL XLNG CF (GLOVE) ×2 IMPLANT
GOWN STRL REUS W/TWL XL LVL3 (GOWN DISPOSABLE) ×4 IMPLANT
HANDPIECE INTERPULSE COAX TIP (DISPOSABLE) ×1
HOLDER FOLEY CATH W/STRAP (MISCELLANEOUS) ×2 IMPLANT
PACK ANTERIOR HIP CUSTOM (KITS) ×2 IMPLANT
RTRCTR WOUND ALEXIS 18CM MED (MISCELLANEOUS)
SET HNDPC FAN SPRY TIP SCT (DISPOSABLE) ×1 IMPLANT
STAPLER VISISTAT 35W (STAPLE) IMPLANT
STRIP CLOSURE SKIN 1/2X4 (GAUZE/BANDAGES/DRESSINGS) ×2 IMPLANT
SUT ETHIBOND NAB CT1 #1 30IN (SUTURE) ×2 IMPLANT
SUT MNCRL AB 4-0 PS2 18 (SUTURE) IMPLANT
SUT VIC AB 0 CT1 36 (SUTURE) ×2 IMPLANT
SUT VIC AB 1 CT1 36 (SUTURE) ×2 IMPLANT
SUT VIC AB 2-0 CT1 27 (SUTURE) ×2
SUT VIC AB 2-0 CT1 TAPERPNT 27 (SUTURE) ×2 IMPLANT
TRAY FOLEY W/METER SILVER 16FR (SET/KITS/TRAYS/PACK) ×2 IMPLANT
YANKAUER SUCT BULB TIP 10FT TU (MISCELLANEOUS) ×2 IMPLANT

## 2017-08-10 NOTE — Progress Notes (Signed)
X-ray results noted 

## 2017-08-10 NOTE — Transfer of Care (Signed)
Immediate Anesthesia Transfer of Care Note  Patient: Danny Bender  Procedure(s) Performed: LEFT TOTAL HIP ARTHROPLASTY ANTERIOR APPROACH (Left )  Patient Location: PACU  Anesthesia Type:Spinal  Level of Consciousness: awake, alert  and oriented  Airway & Oxygen Therapy: Patient Spontanous Breathing and Patient connected to face mask oxygen  Post-op Assessment: Report given to RN and Post -op Vital signs reviewed and stable  Post vital signs: Reviewed and stable  Last Vitals:  Vitals:   08/10/17 0858 08/10/17 0900  BP: (!) 162/84 (!) 155/97  Pulse: (!) 56 (!) 54  Resp: 20 16  Temp: 37.1 C 37 C  SpO2: 100% 100%    Last Pain:  Vitals:   08/10/17 0900  TempSrc: Oral         Complications: No apparent anesthesia complications

## 2017-08-10 NOTE — Anesthesia Procedure Notes (Signed)
Spinal  Patient location during procedure: OR Start time: 08/10/2017 10:05 AM End time: 08/10/2017 10:10 AM Staffing Anesthesiologist: Roberts Gaudy, MD Performed: anesthesiologist  Preanesthetic Checklist Completed: patient identified, site marked, surgical consent, pre-op evaluation, timeout performed, IV checked, risks and benefits discussed and monitors and equipment checked Spinal Block Patient position: sitting Prep: ChloraPrep Patient monitoring: heart rate, cardiac monitor, continuous pulse ox and blood pressure Approach: right paramedian Location: L3-4 Needle Needle type: [place]encan  Needle gauge: 24 G Assessment Sensory level: T6 Additional Notes 14 mg 0.75% Bupivacaine injected easily

## 2017-08-10 NOTE — H&P (Signed)
TOTAL HIP ADMISSION H&P  Patient is admitted for left total hip arthroplasty.  Subjective:  Chief Complaint: left hip pain  HPI: Danny Bender, 61 y.o. male, has a history of pain and functional disability in the left hip(s) due to arthritis and patient has failed non-surgical conservative treatments for greater than 12 weeks to include NSAID's and/or analgesics, corticosteriod injections, flexibility and strengthening excercises and activity modification.  Onset of symptoms was gradual starting 2 years ago with gradually worsening course since that time.The patient noted no past surgery on the left hip(s).  Patient currently rates pain in the left hip at 10 out of 10 with activity. Patient has night pain, worsening of pain with activity and weight bearing, pain that interfers with activities of daily living and pain with passive range of motion. Patient has evidence of subchondral cysts, subchondral sclerosis, periarticular osteophytes and joint space narrowing by imaging studies. This condition presents safety issues increasing the risk of falls.  There is no current active infection.  Patient Active Problem List   Diagnosis Date Noted  . Pain in left hip 04/24/2017  . Unilateral primary osteoarthritis, left hip 04/24/2017   Past Medical History:  Diagnosis Date  . Cancer ([place])    basal cell cancer  . Chronic back pain   . Complication of anesthesia    took days to clear head  . DDD (degenerative disc disease), cervical   . DDD (degenerative disc disease), lumbar   . Discoloration of skin    fingers and toe   . Headache   . History of shingles   . Nasal polyps   . OA (osteoarthritis) of hip    Left, l wrist  . Ruptured disk    lumbar    Past Surgical History:  Procedure Laterality Date  . HERNIA REPAIR  2004  . neck fusion      Current Facility-Administered Medications  Medication Dose Route Frequency Provider Last Rate Last Dose  . ceFAZolin (ANCEF) IVPB 2g/100 mL  premix  2 g Intravenous On Call to OR Pete Pelt, PA-C      . chlorhexidine (HIBICLENS) 4 % liquid 4 application  60 mL Topical Once Erskine Emery W, PA-C      . tranexamic acid (CYKLOKAPRON) 1,000 mg in sodium chloride 0.9 % 100 mL IVPB  1,000 mg Intravenous To OR Pete Pelt, PA-C       No Known Allergies  Social History   Tobacco Use  . Smoking status: Never Smoker  . Smokeless tobacco: Never Used  Substance Use Topics  . Alcohol use: Yes    Comment: 1 glass nightly occ    No family history on file.   Review of Systems  Musculoskeletal: Positive for joint pain.  All other systems reviewed and are negative.   Objective:  Physical Exam  Constitutional: He is oriented to person, place, and time. He appears well-developed and well-nourished.  HENT:  Head: Normocephalic and atraumatic.  Eyes: EOM are normal. Pupils are equal, round, and reactive to light.  Neck: Normal range of motion.  Cardiovascular: Normal rate and regular rhythm.  Respiratory: Effort normal and breath sounds normal.  GI: Soft. Bowel sounds are normal.  Musculoskeletal:       Right hip: He exhibits decreased range of motion, decreased strength, tenderness and bony tenderness.  Neurological: He is alert and oriented to person, place, and time.  Skin: Skin is warm and dry.  Psychiatric: He has a normal mood and affect.  Vital signs in last 24 hours:    Labs:   Estimated body mass index is 22.5 kg/m as calculated from the following:   Height as of 08/06/17: 5\' 8"  (1.727 m).   Weight as of 08/06/17: 148 lb (67.1 kg).   Imaging Review Plain radiographs demonstrate severe degenerative joint disease of the left hip(s). The bone quality appears to be excellent for age and reported activity level.  Assessment/Plan:  End stage arthritis, left hip(s)  The patient history, physical examination, clinical judgement of the provider and imaging studies are consistent with end stage  degenerative joint disease of the left hip(s) and total hip arthroplasty is deemed medically necessary. The treatment options including medical management, injection therapy, arthroscopy and arthroplasty were discussed at length. The risks and benefits of total hip arthroplasty were presented and reviewed. The risks due to aseptic loosening, infection, stiffness, dislocation/subluxation,  thromboembolic complications and other imponderables were discussed.  The patient acknowledged the explanation, agreed to proceed with the plan and consent was signed. Patient is being admitted for inpatient treatment for surgery, pain control, PT, OT, prophylactic antibiotics, VTE prophylaxis, progressive ambulation and ADL's and discharge planning.The patient is planning to be discharged home with home health services

## 2017-08-10 NOTE — Progress Notes (Signed)
Portable AP Pelvis X-ray done. 

## 2017-08-10 NOTE — Anesthesia Postprocedure Evaluation (Signed)
Anesthesia Post Note  Patient: Danny Bender  Procedure(s) Performed: LEFT TOTAL HIP ARTHROPLASTY ANTERIOR APPROACH (Left )     Patient location during evaluation: PACU Anesthesia Type: Spinal Level of consciousness: awake, awake and alert and oriented Pain management: pain level controlled Vital Signs Assessment: post-procedure vital signs reviewed and stable Respiratory status: spontaneous breathing, nonlabored ventilation and respiratory function stable Cardiovascular status: blood pressure returned to baseline Postop Assessment: spinal receding Anesthetic complications: no    Last Vitals:  Vitals:   08/10/17 1745 08/10/17 1800  BP:  124/73  Pulse: (!) 51 (!) 57  Resp:    Temp:    SpO2: 100% 100%    Last Pain:  Vitals:   08/10/17 1800  TempSrc:   PainSc: 7                  Aeralyn Barna COKER

## 2017-08-10 NOTE — Brief Op Note (Signed)
08/10/2017  11:31 AM  PATIENT:  Izora Gala Oats  61 y.o. male  PRE-OPERATIVE DIAGNOSIS:  Osteoarthritis Left Hip  POST-OPERATIVE DIAGNOSIS:  Osteoarthritis Left Hip  PROCEDURE:  Procedure(s): LEFT TOTAL HIP ARTHROPLASTY ANTERIOR APPROACH (Left)  SURGEON:  Surgeon(s) and Role:    Mcarthur Rossetti, MD - Primary  PHYSICIAN ASSISTANT: Benita Stabile, PA-C  ANESTHESIA:   spinal  EBL:  200 mL   COUNTS:  YES  DICTATION: .Other Dictation: Dictation Number [id number]  PLAN OF CARE: Admit to inpatient   PATIENT DISPOSITION:  PACU - hemodynamically stable.   Delay start of Pharmacological VTE agent (>24hrs) due to surgical blood loss or risk of bleeding: no

## 2017-08-10 NOTE — Anesthesia Preprocedure Evaluation (Addendum)
Anesthesia Evaluation  Patient identified by MRN, date of birth, ID band Patient awake    Reviewed: Allergy & Precautions, NPO status , Patient's Chart, lab work & pertinent test results  Airway Mallampati: II  TM Distance: >3 FB Neck ROM: Full    Dental  (+) Teeth Intact, Dental Advisory Given   Pulmonary    breath sounds clear to auscultation       Cardiovascular  Rhythm:Regular Rate:Normal     Neuro/Psych    GI/Hepatic   Endo/Other    Renal/GU      Musculoskeletal   Abdominal   Peds  Hematology   Anesthesia Other Findings   Reproductive/Obstetrics                           Anesthesia Physical Anesthesia Plan  ASA: II  Anesthesia Plan: General   Post-op Pain Management:    Induction:   PONV Risk Score and Plan: Ondansetron and Dexamethasone  Airway Management Planned: Oral ETT  Additional Equipment:   Intra-op Plan:   Post-operative Plan: Extubation in OR  Informed Consent: I have reviewed the patients History and Physical, chart, labs and discussed the procedure including the risks, benefits and alternatives for the proposed anesthesia with the patient or authorized representative who has indicated his/her understanding and acceptance.   Dental advisory given  Plan Discussed with: CRNA and Anesthesiologist  Anesthesia Plan Comments: (Patients requests spinal)      Anesthesia Quick Evaluation

## 2017-08-11 ENCOUNTER — Other Ambulatory Visit: Payer: Self-pay

## 2017-08-11 LAB — BASIC METABOLIC PANEL
ANION GAP: 8 (ref 5–15)
BUN: 12 mg/dL (ref 6–20)
CALCIUM: 8.4 mg/dL — AB (ref 8.9–10.3)
CO2: 25 mmol/L (ref 22–32)
CREATININE: 0.67 mg/dL (ref 0.61–1.24)
Chloride: 101 mmol/L (ref 101–111)
GFR calc Af Amer: >60 mL/min (ref >60)
GLUCOSE: 131 mg/dL — AB (ref 65–99)
Potassium: 3.9 mmol/L (ref 3.5–5.1)
Sodium: 134 mmol/L — ABNORMAL LOW (ref 135–145)

## 2017-08-11 LAB — CBC
HCT: 32.2 % — ABNORMAL LOW (ref 39.0–52.0)
HEMOGLOBIN: 10.9 g/dL — AB (ref 13.0–17.0)
MCH: 32.2 pg (ref 26.0–34.0)
MCHC: 33.9 g/dL (ref 30.0–36.0)
MCV: 95 fL (ref 78.0–100.0)
PLATELETS: 168 10*3/uL (ref 150–400)
RBC: 3.39 MIL/uL — AB (ref 4.22–5.81)
RDW: 13 % (ref 11.5–15.5)
WBC: 7.7 10*3/uL (ref 4.0–10.5)

## 2017-08-11 MED ORDER — OXYCODONE-ACETAMINOPHEN 5-325 MG PO TABS: 1.0000 | ORAL_TABLET | ORAL | 0 refills | Status: DC | PRN

## 2017-08-11 MED ORDER — METHOCARBAMOL 500 MG PO TABS: 500.0000 mg | ORAL_TABLET | Freq: Four times a day (QID) | ORAL | 0 refills | Status: DC | PRN

## 2017-08-11 MED ORDER — ASPIRIN 325 MG PO TBEC: 325.0000 mg | DELAYED_RELEASE_TABLET | Freq: Every day | ORAL | 0 refills | Status: DC

## 2017-08-11 NOTE — Progress Notes (Signed)
Physical Therapy Treatment Patient Details Name: Danny Bender MRN: [id number] DOB: 01-22-1956 Today's Date: 08/11/2017    History of Present Illness 61 y/o WM s/p L anterior THA (08/10/17).  PMH includes chronic back pain, DDD-cervical and lumbar, neck fusion    PT Comments    POD # 1 pm session to address stairs Out of town brother present and plans to assist pt till Jan 4th as pt lives home alone.  Pt required repeat instruction and extra time for direction due to his behavior.  Pt is easily distracted, impaired attention, impulsive and argumentative.  Pt required repeat instruction on all safety mobility and proper use of walker for safety.  Practiced stairs with brother was quite an event, near fall as pt was stepping incorrectly and not adhering to instructions.  Assisted with amb pt in hallway, pt kept letting go with his hands as he spoke in gesture.  Pt instructed on safety with turns and navigating around obstacles.    Also while in room, pt pushed walker to the side and reached far outside his base of support for the recliner.  Redirected on safety and fall risk after surgery.     Follow Up Recommendations  Home health PT     Equipment Recommendations  Rolling walker with 5" wheels;3in1 (PT)    Recommendations for Other Services       Precautions / Restrictions Precautions Precautions: Fall Restrictions Weight Bearing Restrictions: Yes LLE Weight Bearing: Weight bearing as tolerated    Mobility  Bed Mobility Overal bed mobility: Needs Assistance Bed Mobility: Supine to Sit     Supine to sit: Supervision;HOB elevated     General bed mobility comments: OOB in recliner  Transfers Overall transfer level: Needs assistance Equipment used: Rolling walker (2 wheeled) Transfers: Sit to/from Stand Sit to Stand: Supervision;Min guard         General transfer comment: 75% cues for hand placement and safety with turns  Ambulation/Gait Ambulation/Gait  assistance: Supervision;Min guard Ambulation Distance (Feet): 75 Feet Assistive device: Rolling walker (2 wheeled) Gait Pattern/deviations: Decreased stance time - right;Decreased step length - left;Trunk flexed Gait velocity: decreased   General Gait Details: 50% VC's on proper walker to self distance and VC safety (do not pick walker up, do not let go of walker, use walker throughout your turn, do not park walker to side)     Stairs Stairs: Yes   Stair Management: One rail Left Number of Stairs: 4 General stair comments: with brother present and 75% VC's on proper sequencing, safety and foot placement.    Wheelchair Mobility    Modified Rankin (Stroke Patients Only)       Balance Overall balance assessment: Needs assistance         Standing balance support: Bilateral upper extremity supported Standing balance-Leahy Scale: Fair                              Cognition Arousal/Alertness: Awake/alert Behavior During Therapy: WFL for tasks assessed/performed Overall Cognitive Status: Within Functional Limits for tasks assessed                                        Exercises      General Comments General comments (skin integrity, edema, etc.): o2  99% on RA.  Pt with tremors at various times in session which pt attributes  to being cold.      Pertinent Vitals/Pain Pain Assessment: 0-10 Pain Score: 8  Faces Pain Scale: Hurts even more(pt unable to stay focused to rate on 0-10 scale) Pain Location: L hip and back Pain Descriptors / Indicators: Grimacing;Operative site guarding;Tender Pain Intervention(s): Monitored during session;Repositioned;Premedicated before session;Ice applied    Home Living Family/patient expects to be discharged to:: Private residence Living Arrangements: Alone(brother til Jan 4th) Available Help at Discharge: Family Type of Home: House Home Access: Stairs to enter Entrance Stairs-Rails: Left Home Layout: Two  level;1/2 bath on main level Home Equipment: Cane - single point Additional Comments: Pt states he is going to sleep on an air mattress on the floor due to not having a bed downstairs.  Discussed using couch or recliner as other options.    Prior Function Level of Independence: Independent          PT Goals (current goals can now be found in the care plan section) Acute Rehab PT Goals Patient Stated Goal: go home and get some sleep PT Goal Formulation: With patient Time For Goal Achievement: 08/25/17 Potential to Achieve Goals: Good Progress towards PT goals: Progressing toward goals    Frequency    7X/week      PT Plan Current plan remains appropriate    Co-evaluation              AM-PAC PT "6 Clicks" Daily Activity  Outcome Measure  Difficulty turning over in bed (including adjusting bedclothes, sheets and blankets)?: A Little Difficulty moving from lying on back to sitting on the side of the bed? : A Little Difficulty sitting down on and standing up from a chair with arms (e.g., wheelchair, bedside commode, etc,.)?: A Little Help needed moving to and from a bed to chair (including a wheelchair)?: A Little Help needed walking in hospital room?: A Little Help needed climbing 3-5 steps with a railing? : A Little 6 Click Score: 18    End of Session Equipment Utilized During Treatment: Gait belt Activity Tolerance: Patient tolerated treatment well Patient left: in chair;with call bell/phone within reach;with nursing/sitter in room;with family/visitor present(nurse giving meds) Nurse Communication: Mobility status PT Visit Diagnosis: Muscle weakness (generalized) (M62.81);Difficulty in walking, not elsewhere classified (R26.2)     Time: 1200-1240 PT Time Calculation (min) (ACUTE ONLY): 40 min  Charges:  $Gait Training: 8-22 mins $Therapeutic Activity: 8-22 mins $Self Care/Home Management: 8-22                    G Codes:       Rica Koyanagi  PTA [facility]  Acute   Rehab Pager      [phone]

## 2017-08-11 NOTE — Discharge Summary (Signed)
Patient ID: Danny Bender MRN: [id number] DOB/AGE: 1956-07-26 61 y.o.  Admit date: 08/10/2017 Discharge date: 08/11/2017  Admission Diagnoses:  Principal Problem:   Unilateral primary osteoarthritis, left hip Active Problems:   Status post total replacement of left hip   Discharge Diagnoses:  Same  Past Medical History:  Diagnosis Date  . Cancer ([place])    basal cell cancer  . Chronic back pain   . Complication of anesthesia    took days to clear head  . DDD (degenerative disc disease), cervical   . DDD (degenerative disc disease), lumbar   . Discoloration of skin    fingers and toe   . Headache   . History of shingles   . Nasal polyps   . OA (osteoarthritis) of hip    Left, l wrist  . Ruptured disk    lumbar    Surgeries: Procedure(s): LEFT TOTAL HIP ARTHROPLASTY ANTERIOR APPROACH on 08/10/2017   Consultants:   Discharged Condition: Improved  Hospital Course: Danny Bender is an 61 y.o. male who was admitted 08/10/2017 for operative treatment ofUnilateral primary osteoarthritis, left hip. Patient has severe unremitting pain that affects sleep, daily activities, and work/hobbies. After pre-op clearance the patient was taken to the operating room on 08/10/2017 and underwent  Procedure(s): LEFT TOTAL HIP ARTHROPLASTY ANTERIOR APPROACH.    Patient was given perioperative antibiotics:  Anti-infectives (From admission, onward)   Start     Dose/Rate Route Frequency Ordered Stop   08/10/17 1600  ceFAZolin (ANCEF) IVPB 1 g/50 mL premix     1 g 100 mL/hr over 30 Minutes Intravenous Every 6 hours 08/10/17 1523 08/11/17 0359   08/10/17 0840  ceFAZolin (ANCEF) 2-4 GM/100ML-% IVPB    Comments:  [place] Session   : cabinet override      08/10/17 0840 08/10/17 2044   08/10/17 0836  ceFAZolin (ANCEF) IVPB 2g/100 mL premix     2 g 200 mL/hr over 30 Minutes Intravenous On call to O.R. 08/10/17 0836 08/10/17 1032       Patient was given sequential compression  devices, early ambulation, and chemoprophylaxis to prevent DVT.  Patient benefited maximally from hospital stay and there were no complications.    Recent vital signs:  Patient Vitals for the past 24 hrs:  BP Temp Temp src Pulse Resp SpO2  08/11/17 0500 (!) 150/134 98.8 F (37.1 C) Oral (!) 54 (!) 24 100 %  08/11/17 0139 119/69 98.9 F (37.2 C) Oral (!) 55 18 100 %  08/10/17 2148 125/66 98.8 F (37.1 C) Oral (!) 54 18 100 %  08/10/17 2049 133/66 99.4 F (37.4 C) Oral 80 16 100 %  08/10/17 1942 122/70 99.2 F (37.3 C) Oral (!) 53 17 100 %  08/10/17 1849 (!) 143/90 98.1 F (36.7 C) - (!) 47 18 95 %  08/10/17 1828 124/73 99.2 F (37.3 C) - - 18 -  08/10/17 1800 124/73 - - (!) 57 - 100 %  08/10/17 1745 - - - (!) 51 - 100 %  08/10/17 1730 - - - (!) 50 - 100 %  08/10/17 1715 - - - (!) 51 - 100 %  08/10/17 1700 108/62 98 F (36.7 C) - (!) 46 13 100 %  08/10/17 1645 - - - (!) 55 - 100 %  08/10/17 1630 - - - (!) 48 - 100 %  08/10/17 1615 - - - (!) 52 - 100 %  08/10/17 1600 111/67 - - (!) 51 14 100 %  08/10/17 1545 - - - Marland Kitchen)  57 - 100 %  08/10/17 1530 122/67 - - (!) 57 19 100 %  08/10/17 1515 - - - (!) 57 - 100 %  08/10/17 1500 115/79 99 F (37.2 C) - (!) 53 15 100 %  08/10/17 1445 119/67 - - (!) 49 15 100 %  08/10/17 1430 132/74 - - 60 15 100 %  08/10/17 1415 138/68 - - (!) 50 - 100 %  08/10/17 1400 128/66 98.4 F (36.9 C) - (!) 51 15 100 %  08/10/17 1345 122/71 - - (!) 43 (!) 9 100 %  08/10/17 1330 (!) 144/72 - - (!) 48 14 100 %  08/10/17 1316 (!) 143/62 - - (!) 47 14 100 %  08/10/17 1315 - - - (!) 49 17 100 %  08/10/17 1300 137/67 97.8 F (36.6 C) - (!) 46 16 100 %  08/10/17 1245 (!) 146/63 - - (!) 44 20 100 %  08/10/17 1241 - - - (!) 39 16 100 %  08/10/17 1230 (!) 150/72 - - (!) 40 14 100 %  08/10/17 1223 - - - (!) 40 17 100 %  08/10/17 1220 - - - (!) 37 15 100 %  08/10/17 1215 (!) 154/67 - - (!) 38 12 100 %  08/10/17 1200 133/66 - - (!) 44 19 100 %  08/10/17 1151  134/69 98 F (36.7 C) - (!) 45 15 100 %     Recent laboratory studies:  Recent Labs    08/11/17 0436  WBC 7.7  HGB 10.9*  HCT 32.2*  PLT 168  NA 134*  K 3.9  CL 101  CO2 25  BUN 12  CREATININE 0.67  GLUCOSE 131*  CALCIUM 8.4*     Discharge Medications:   Allergies as of 08/11/2017   No Known Allergies     Medication List    STOP taking these medications   diclofenac 75 MG EC tablet Commonly known as:  VOLTAREN     TAKE these medications   aspirin 325 MG EC tablet Take 1 tablet (325 mg total) by mouth daily with breakfast.   fluticasone 50 MCG/ACT nasal spray Commonly known as:  FLONASE Place 1 spray into both nostrils daily.   GLUCOSAMINE SULFATE PO Take 1 tablet by mouth daily.   methocarbamol 500 MG tablet Commonly known as:  ROBAXIN Take 1 tablet (500 mg total) by mouth every 6 (six) hours as needed for muscle spasms.   milk thistle 175 MG tablet Take 175 mg by mouth daily.   oxyCODONE-acetaminophen 5-325 MG tablet Commonly known as:  ROXICET Take 1-2 tablets by mouth every 4 (four) hours as needed.   pyridoxine 100 MG tablet Commonly known as:  B-6 Take 50 mg by mouth daily.   vitamin B-12 1000 MCG tablet Commonly known as:  CYANOCOBALAMIN Take 1,000 mcg by mouth daily.   zinc gluconate 50 MG tablet Take 25 mg by mouth daily.            Durable Medical Equipment  (From admission, onward)        Start     Ordered   08/10/17 1524  DME 3 n 1  Once     08/10/17 1523   08/10/17 1524  DME Walker rolling  Once    Question:  Patient needs a walker to treat with the following condition  Answer:  Status post total replacement of left hip   08/10/17 1523      Diagnostic Studies: Dg Pelvis Portable  Result  Date: 08/10/2017 CLINICAL DATA:  Status post left hip arthroplasty. EXAM: PORTABLE PELVIS 1-2 VIEWS COMPARISON:  Operative images obtained earlier today. FINDINGS: Total left hip arthroplasty components appear well seated and well  aligned on the single AP view. There is no acute fracture or evidence of an operative complication. IMPRESSION: Well-positioned left hip total arthroplasty. Electronically Signed   By: Lajean Manes M.D.   On: 08/10/2017 13:11   Dg C-arm 1-60 Min-no Report  Result Date: 08/10/2017 Fluoroscopy was utilized by the requesting physician.  No radiographic interpretation.   Dg Hip Operative Unilat With Pelvis Left  Result Date: 08/10/2017 CLINICAL DATA:  Intra op left side anterior approach total hip replacement. Hx of OA. EXAM: OPERATIVE left HIP (WITH PELVIS IF PERFORMED) 2 VIEWS TECHNIQUE: Fluoroscopic spot image(s) were submitted for interpretation post-operatively. COMPARISON:  None. FINDINGS: Two intraoperative fluoroscopic images are provided. Left hip arthroplasty hardware appears intact and appropriately positioned. Adjacent osseous structures appear anatomic in alignment. Fluoroscopy provided for 38 seconds.  2.78 mGy IMPRESSION: Intraoperative fluoroscopic images demonstrating left hip arthroplasty. Hardware appears intact and appropriately positioned. No evidence of surgical complicating feature. Electronically Signed   By: Franki Cabot M.D.   On: 08/10/2017 11:49    Disposition: to home  Discharge Instructions    Discharge patient   Complete by:  As directed    Discharge disposition:  01-Home or Self Care   Discharge patient date:  08/11/2017      Follow-up Information    Mcarthur Rossetti, MD Follow up in 2 week(s).   Specialty:  Orthopedic Surgery Contact information: [place] [address] [phone]            Signed: Mcarthur Rossetti 08/11/2017, 10:34 AM

## 2017-08-11 NOTE — Progress Notes (Signed)
Nurse reviewed discharge instructions with pt.  Pt verbalized understanding of discharge instructions, follow up appointments and new medications.  Prescriptions given to pt prior to discharge. 

## 2017-08-11 NOTE — Evaluation (Signed)
Occupational Therapy Evaluation Patient Details Name: Danny Bender MRN: [id number] DOB: [birth year] Today's Date: 08/11/2017    History of Present Illness 61 y/o WM s/p L anterior THA (08/10/17).  PMH includes chronic back pain, DDD-cervical and lumbar, neck fusion   Clinical Impression   OT education complete.  Brother will A as needed.     Follow Up Recommendations  No OT follow up    Equipment Recommendations  3 in 1 bedside commode       Precautions / Restrictions Precautions Precautions: Fall Restrictions Weight Bearing Restrictions: Yes LLE Weight Bearing: Weight bearing as tolerated      Mobility Bed Mobility Overal bed mobility: Needs Assistance Bed Mobility: Supine to Sit     Supine to sit: Supervision;HOB elevated     General bed mobility comments: pt in chair  Transfers Overall transfer level: Needs assistance Equipment used: Rolling walker (2 wheeled) Transfers: Sit to/from Stand Sit to Stand: Supervision;Min guard         General transfer comment: cues for hand placement.     Balance Overall balance assessment: Needs assistance         Standing balance support: Bilateral upper extremity supported Standing balance-Leahy Scale: Fair                             ADL either performed or assessed with clinical judgement   ADL Overall ADL's : Needs assistance/impaired Eating/Feeding: Set up;Sitting   Grooming: Set up;Sitting   Upper Body Bathing: Set up;Sitting   Lower Body Bathing: Minimal assistance;Sit to/from stand;Cueing for sequencing;Cueing for safety;Cueing for compensatory techniques   Upper Body Dressing : Set up;Sitting   Lower Body Dressing: Minimal assistance;Sit to/from stand;Cueing for safety;Cueing for sequencing;With adaptive equipment   Toilet Transfer: Minimal assistance;RW;Comfort height toilet   Toileting- Clothing Manipulation and Hygiene: Minimal assistance;Sit to/from Educational psychologist Details (indicate cue type and reason): verbalized safety Functional mobility during ADLs: Minimal assistance General ADL Comments: pt unsafe during eval. Educated on safe option to perform ADL activty - such as sitting for dressing. Pt insisted on standing. Pt educated in Ae.  OT noted LOB in standing and pt has excuse and did not acknowledge  that he lost his balance. Brother apologized for pts behavior.     Vision Patient Visual Report: No change from baseline              Pertinent Vitals/Pain Pain Assessment: Faces Pain Score: 5  Faces Pain Scale: Hurts even more(pt unable to stay focused to rate on 0-10 scale) Pain Location: L hip and back Pain Descriptors / Indicators: Grimacing Pain Intervention(s): Limited activity within patient's tolerance;Monitored during session;Repositioned;Patient requesting pain meds-RN notified     Hand Dominance     Extremity/Trunk Assessment Upper Extremity Assessment Upper Extremity Assessment: Overall WFL for tasks assessed   Lower Extremity Assessment Lower Extremity Assessment: LLE deficits/detail LLE Deficits / Details: fair + quad set, L hip flex ~45 degrees LLE: Unable to fully assess due to pain   Cervical / Trunk Assessment Cervical / Trunk Assessment: Other exceptions Cervical / Trunk Exceptions: cervical fusion   Communication Communication Communication: No difficulties   Cognition Arousal/Alertness: Awake/alert Behavior During Therapy: WFL for tasks assessed/performed Overall Cognitive Status: Within Functional Limits for tasks assessed  General Comments  o2  99% on RA.  Pt with tremors at various times in session which pt attributes to being cold.            Home Living Family/patient expects to be discharged to:: Private residence Living Arrangements: Alone(brother til Jan 4th) Available Help at Discharge: Family Type of Home: House Home Access: Stairs  to enter Technical brewer of Steps: 3-4 Entrance Stairs-Rails: Left Home Layout: Two level;1/2 bath on main level Alternate Level Stairs-Number of Steps: flight with landing   Bathroom Shower/Tub: Walk-in shower(upstairs)   Biochemist, clinical: Standard     Home Equipment: Cane - single point   Additional Comments: Pt states he is going to sleep on an air mattress on the floor due to not having a bed downstairs.  Discussed using couch or recliner as other options.      Prior Functioning/Environment Level of Independence: Independent                          OT Goals(Current goals can be found in the care plan section) Acute Rehab OT Goals Patient Stated Goal: go home and get some sleep  OT Frequency:      AM-PAC PT "6 Clicks" Daily Activity     Outcome Measure Help from another person eating meals?: None Help from another person taking care of personal grooming?: None Help from another person toileting, which includes using toliet, bedpan, or urinal?: A Little Help from another person bathing (including washing, rinsing, drying)?: A Little Help from another person to put on and taking off regular upper body clothing?: None Help from another person to put on and taking off regular lower body clothing?: A Little 6 Click Score: 21   End of Session Nurse Communication: Mobility status  Activity Tolerance: Patient tolerated treatment well Patient left: in chair;with call bell/phone within reach                   Time: 1342-1415 OT Time Calculation (min): 33 min Charges:  OT General Charges $OT Visit: 1 Visit OT Evaluation $OT Eval Low Complexity: 1 Low OT Treatments $Self Care/Home Management : 8-22 mins G-Codes:     08/14/17      [place], Edwena Felty D August 14, 2017, 2:43 PM

## 2017-08-11 NOTE — Evaluation (Signed)
Physical Therapy Evaluation Patient Details Name: Danny Bender MRN: [id number] DOB: 1956/07/24 Today's Date: 08/11/2017   History of Present Illness  61 y/o WM s/p L anterior THA (08/10/17).  PMH includes chronic back pain, DDD-cervical and lumbar, neck fusion  Clinical Impression  Pt admitted with above diagnosis. Pt currently with functional limitations due to the deficits listed below (see PT Problem List). Pt will benefit from skilled PT to increase their independence and safety with mobility to allow discharge to the venue listed below.  Pt is scheduled to d/c today.  He will need one more session after he has pain meds and some time to rest to practice stair training.  Discussed with pt and brother and they were okay with this.  Recommend HHPT, 3-1 BSC, and RW.     Follow Up Recommendations Home health PT    Equipment Recommendations  Rolling walker with 5" wheels;3in1 (PT)    Recommendations for Other Services       Precautions / Restrictions Precautions Precautions: Fall Restrictions Weight Bearing Restrictions: Yes LLE Weight Bearing: Weight bearing as tolerated      Mobility  Bed Mobility Overal bed mobility: Needs Assistance Bed Mobility: Supine to Sit     Supine to sit: Supervision;HOB elevated     General bed mobility comments: slow transition, but no A.  Reviewed use of R leg or leg lifter to A to get back into bed.  Transfers Overall transfer level: Needs assistance Equipment used: Rolling walker (2 wheeled) Transfers: Sit to/from Stand Sit to Stand: Min guard         General transfer comment: cues for hand placement. Very slow transition from stand > sit  Ambulation/Gait Ambulation/Gait assistance: Min guard Ambulation Distance (Feet): 35 Feet Assistive device: Rolling walker (2 wheeled) Gait Pattern/deviations: Decreased stance time - right;Decreased step length - left;Trunk flexed     General Gait Details: Pt with slow gait pattern and  needed cueing 75% of the time for proper sequencing.  When returning to room, he did not remember nurse had been in there when he left it.  Stairs            Wheelchair Mobility    Modified Rankin (Stroke Patients Only)       Balance Overall balance assessment: Needs assistance         Standing balance support: Bilateral upper extremity supported Standing balance-Leahy Scale: Fair                               Pertinent Vitals/Pain Pain Assessment: Faces Faces Pain Scale: Hurts even more(pt unable to stay focused to rate on 0-10 scale) Pain Location: L hip and back Pain Descriptors / Indicators: Grimacing Pain Intervention(s): Monitored during session;Patient requesting pain meds-RN notified    Home Living Family/patient expects to be discharged to:: Private residence Living Arrangements: Alone(brother til Jan 4th) Available Help at Discharge: Family Type of Home: House Home Access: Stairs to enter Entrance Stairs-Rails: Left Entrance Stairs-Number of Steps: 3-4 Home Layout: Two level;1/2 bath on main level Home Equipment: Cane - single point Additional Comments: Pt states he is going to sleep on an air mattress on the floor due to not having a bed downstairs.  Discussed using couch or recliner as other options.    Prior Function Level of Independence: Independent               Hand Dominance  Extremity/Trunk Assessment   Upper Extremity Assessment Upper Extremity Assessment: Defer to OT evaluation    Lower Extremity Assessment Lower Extremity Assessment: LLE deficits/detail LLE Deficits / Details: fair + quad set, L hip flex ~45 degrees LLE: Unable to fully assess due to pain    Cervical / Trunk Assessment Cervical / Trunk Assessment: Other exceptions Cervical / Trunk Exceptions: cervical fusion  Communication   Communication: No difficulties  Cognition Arousal/Alertness: Awake/alert Behavior During Therapy: WFL for tasks  assessed/performed Overall Cognitive Status: Within Functional Limits for tasks assessed                                        General Comments General comments (skin integrity, edema, etc.): o2  99% on RA.  Pt with tremors at various times in session which pt attributes to being cold.    Exercises     Assessment/Plan    PT Assessment Patient needs continued PT services  PT Problem List Decreased strength;Decreased range of motion;Decreased balance;Decreased mobility;Decreased knowledge of use of DME       PT Treatment Interventions DME instruction;Gait training;Stair training;Functional mobility training;Therapeutic exercise;Therapeutic activities;Balance training    PT Goals (Current goals can be found in the Care Plan section)  Acute Rehab PT Goals Patient Stated Goal: go home and get some sleep PT Goal Formulation: With patient Time For Goal Achievement: 08/25/17 Potential to Achieve Goals: Good    Frequency 7X/week   Barriers to discharge Inaccessible home environment      Co-evaluation               AM-PAC PT "6 Clicks" Daily Activity  Outcome Measure Difficulty turning over in bed (including adjusting bedclothes, sheets and blankets)?: A Little Difficulty moving from lying on back to sitting on the side of the bed? : A Little Difficulty sitting down on and standing up from a chair with arms (e.g., wheelchair, bedside commode, etc,.)?: A Little Help needed moving to and from a bed to chair (including a wheelchair)?: A Little Help needed walking in hospital room?: A Little Help needed climbing 3-5 steps with a railing? : A Little 6 Click Score: 18    End of Session Equipment Utilized During Treatment: Gait belt Activity Tolerance: Patient tolerated treatment well Patient left: in chair;with call bell/phone within reach;with nursing/sitter in room;with family/visitor present(nurse giving meds) Nurse Communication: Mobility status PT Visit  Diagnosis: Muscle weakness (generalized) (M62.81);Difficulty in walking, not elsewhere classified (R26.2)    Time: 1610-9604 PT Time Calculation (min) (ACUTE ONLY): 50 min   Charges:   PT Evaluation $PT Eval Moderate Complexity: 1 Mod PT Treatments $Gait Training: 8-22 mins $Therapeutic Exercise: 8-22 mins   PT G Codes:        Danny Lekas L. Tamala Julian, [place] Pager [phone] 08/11/2017   Galen Manila 08/11/2017, 12:15 PM

## 2017-08-11 NOTE — Progress Notes (Signed)
Subjective: 1 Day Post-Op Procedure(s) (LRB): LEFT TOTAL HIP ARTHROPLASTY ANTERIOR APPROACH (Left) Patient reports pain as moderate.  Doing well overall.  Did not sleep last night.  Wants to go home today after gets up with therapy.  Objective: Vital signs in last 24 hours: Temp:  [97.8 F (36.6 C)-99.4 F (37.4 C)] 98.8 F (37.1 C) (12/22 0500) Pulse Rate:  [37-80] 54 (12/22 0500) Resp:  [9-24] 24 (12/22 0500) BP: (108-154)/(62-134) 150/134 (12/22 0500) SpO2:  [95 %-100 %] 100 % (12/22 0500)  Intake/Output from previous day: 12/21 0701 - 12/22 0700 In: 2380 [P.O.:600; I.V.:1730; IV Piggyback:50] Out: 2475 [Urine:2275; Blood:200] Intake/Output this shift: Total I/O In: -  Out: 400 [Urine:400]  Recent Labs    08/11/17 0436  HGB 10.9*   Recent Labs    08/11/17 0436  WBC 7.7  RBC 3.39*  HCT 32.2*  PLT 168   Recent Labs    08/11/17 0436  NA 134*  K 3.9  CL 101  CO2 25  BUN 12  CREATININE 0.67  GLUCOSE 131*  CALCIUM 8.4*   No results for input(s): LABPT, INR in the last 72 hours.  Sensation intact distally Intact pulses distally Dorsiflexion/Plantar flexion intact Incision: dressing C/D/I  Assessment/Plan: 1 Day Post-Op Procedure(s) (LRB): LEFT TOTAL HIP ARTHROPLASTY ANTERIOR APPROACH (Left) Up with therapy Discharge home with home health today.  Mcarthur Rossetti 08/11/2017, 10:31 AM

## 2017-08-11 NOTE — Care Management Note (Signed)
Case Management Note  Patient Details  Name: Danny Bender MRN: [id number] Date of Birth: 05-07-1956  Subjective/Objective:     S/p L THA               Action/Plan: Discharge Planning: Spoke to pt and offered choice for [facility]. Pt preoperatively arranged with [facility]. Pt agreeable to [facility]. Contacted [facility] for RW and 3n1 bedside commode for home to be delivered to room prior to dc.   PCP [name] MD  Expected Discharge Date:  08/11/17               Expected Discharge Plan:  [place]  In-House Referral:  NA  Discharge planning Services  CM Consult  Post Acute Care Choice:  Home Health Choice offered to:  Patient  DME Arranged:  3-N-1, Walker rolling DME Agency:  [place]:  PT [place] Agency:  [facility] ([facility])  Status of Service:  Completed, signed off  If discussed at [employer] of Stay Meetings, dates discussed:    Additional Comments:  Erenest Rasher, RN 08/11/2017, 2:41 PM

## 2017-08-11 NOTE — Discharge Instructions (Signed)

## 2017-08-15 ENCOUNTER — Encounter (HOSPITAL_COMMUNITY): Payer: Self-pay | Admitting: Orthopaedic Surgery

## 2017-08-15 ENCOUNTER — Telehealth (INDEPENDENT_AMBULATORY_CARE_PROVIDER_SITE_OTHER): Payer: Self-pay | Admitting: Radiology

## 2017-08-15 NOTE — Telephone Encounter (Signed)
Keisha from [facility] called to notify received referral and will be seeing patient today.

## 2017-08-15 NOTE — Op Note (Signed)
NAME:  Danny Bender, Danny Bender                ACCOUNT NO.:  MEDICAL RECORD NO.:  [id number]  LOCATION:                                 FACILITY:  PHYSICIAN:  Lind Guest. [name], M.D.DATE OF BIRTH:  DATE OF PROCEDURE:  08/10/2017 DATE OF DISCHARGE:                              OPERATIVE REPORT   PREOPERATIVE DIAGNOSIS:  Primary osteoarthritis and degenerative joint disease, left hip.  POSTOPERATIVE DIAGNOSIS:  Primary osteoarthritis and degenerative joint disease, left hip.  PROCEDURE:  Left total hip arthroplasty through direct anterior approach.  IMPLANTS:  DePuy Sector Gription acetabular component size 54, size 36 +4 polyethylene liner, size 12 Corail femoral component with varus offset, size 36 +8.5 ceramic hip ball.  SURGEON:  Lind Guest. [name], M.D.  ASSISTANT:  Erskine Emery, PA-C.  ANESTHESIA:  Spinal.  BLOOD LOSS:  200 mL.  ANTIBIOTICS:  2 g of IV Ancef.  COMPLICATIONS:  None.  INDICATIONS:  Danny Bender is a 61 year old active individual with debilitating arthritis of his left hip, it is well documented.  He has x- ray showing complete loss of the joint space, and at this point, his left leg is shorter than his right.  His pain is daily and it is detrimentally affected his activities of daily living, his quality of life and his mobility.  He has tried and failed all forms of conservative treatment, and at this point, does wish to proceed with a total hip arthroplasty.  We had a long and thorough discussion about the risks and benefits of the surgery including the risk of acute blood loss anemia, nerve and vessel injury, fracture, infection, dislocation, DVT. He understands our goals are to decrease pain, improve mobility and overall improved quality of life.  PROCEDURE DESCRIPTION:  After informed consent was obtained, appropriate left hip was marked.  He was brought to the operating room where spinal anesthesia was obtained.  He was laid supine on  a stretcher.  A Foley catheter was placed and both feet had traction boots applied to them.  I did preoperatively assess his leg lengths again and he was definitely shorter on the left than the right.  We then placed him supine on the Hana fracture table with the perineal post in place and both legs in inline skeletal traction devices, but no traction applied.  The left operative hip was prepped and draped with DuraPrep and sterile drapes. A time-out was called and he was identified as correct patient and correct left hip.  We then made an incision just inferior and posterior to the anterior superior iliac spine and carried this obliquely down the leg.  We dissected down the tensor fascia lata muscle.  The tensor fascia was then divided longitudinally to proceed with a direct anterior approach to the hip.  We identified and cauterized the circumflex vessels and identified the hip capsule.  I opened up the hip capsule in an L-type format, finding a large joint effusion and significant arthritis throughout the left hip.  We placed Cobra retractors around the medial and lateral femoral neck and then made our femoral neck cut with an oscillating saw and completed this with an osteotome.  We placed a corkscrew guide  in the femoral head and removed the femoral head in its entirety and found it to be completely devoid of cartilage.  We then placed a bent Hohmann over the medial acetabular rim and removed remnants of the acetabular labrum.  I then begun reaming under direct visualization from a size 43 reamer going up to a size 54 with all reamers under direct visualization and the last reamer under direct fluoroscopy, so we could obtain our depth of reaming, our inclination and anteversion.  Once I was pleased with this, we placed the real DePuy Sector Gription acetabular component size 54 and a size 36 +4 polyethylene liner for that size acetabular component.  Attention was then turned to the  femur.  With the leg externally rotated to 120 degrees, extended and adducted, we were able to place a Mueller retractor medially and a Hohmann retractor behind the greater trochanter.  We released the lateral joint capsule and used a box cutting osteotome to enter the femoral canal and a rongeur to lateralize.  We then begun broaching using the Corail broaching system from a size 8 broach going up to a size 12.  With the 12 in place, we trialed a varus offset femoral neck and a 36 +1.5 hip ball.  We reduced this in the acetabulum and he was still significantly short.  We dislocated the hip and removed the trial components.  We were able to place the real Corail femoral component with varus offset size 12 and the real 36 +8.5 hip ball to get his lengths back.  This was challenging to reduce given that he started short, but we were able to reduce it. After that, I was pleased with leg length, offset, range of motion and stability.  We then irrigated the soft tissue with normal saline solution using pulsatile lavage.  We closed the joint capsule with interrupted #1 Ethibond suture followed by running #1 Vicryl in the tensor fascia, 0 Vicryl in the deep tissue, 2-0 Vicryl in the subcutaneous tissue, 4-0 Monocryl subcuticular stitch and Steri-Strips on the skin.  An Aquacel dressing was applied.  He was taken off the Hana table and taken to the recovery room in stable condition.  All final counts were correct.  There were no complications noted.     Lind Guest. [name], M.D.     CYB/MEDQ  D:  08/10/2017  T:  08/10/2017  Job:  [id number]

## 2017-08-15 NOTE — Telephone Encounter (Signed)
[facility] called, needs orders for PT.  3x week x 1 week, 2x week x 1 week.  Please call her to give verbal.

## 2017-08-16 ENCOUNTER — Telehealth (INDEPENDENT_AMBULATORY_CARE_PROVIDER_SITE_OTHER): Payer: Self-pay | Admitting: Radiology

## 2017-08-16 NOTE — Telephone Encounter (Signed)
I called and left voicemail for [place] giving verbal ok.

## 2017-08-16 NOTE — Telephone Encounter (Signed)
I called spoke with patient to advise of message below.

## 2017-08-16 NOTE — Telephone Encounter (Signed)
Patients brother calling [facility]ki [facility], states patient is status post surgery 12/21. He has not had a bowel movement in 9 days. He has tried dulcolax for 4 days and now is trying miralax without relief. Nothing is really helping and starting to cause him more pain. They are wanting to know what else he could do. Please advise and I will call back.

## 2017-08-16 NOTE — Telephone Encounter (Signed)
He can go to the drug store and get sorbitol as well as trying a dulcolax suppository and even an enema

## 2017-08-20 ENCOUNTER — Emergency Department (HOSPITAL_COMMUNITY): Payer: BLUE CROSS/BLUE SHIELD

## 2017-08-20 ENCOUNTER — Encounter (HOSPITAL_COMMUNITY): Payer: Self-pay | Admitting: Emergency Medicine

## 2017-08-20 ENCOUNTER — Emergency Department (HOSPITAL_BASED_OUTPATIENT_CLINIC_OR_DEPARTMENT_OTHER)
Admit: 2017-08-20 | Discharge: 2017-08-20 | Disposition: A | Discharge status: Home / Self Care | Payer: BLUE CROSS/BLUE SHIELD | Attending: Emergency Medicine | Admitting: Emergency Medicine

## 2017-08-20 ENCOUNTER — Telehealth (INDEPENDENT_AMBULATORY_CARE_PROVIDER_SITE_OTHER): Payer: Self-pay | Admitting: *Deleted

## 2017-08-20 ENCOUNTER — Observation Stay (HOSPITAL_COMMUNITY)
Admission: EM | Admit: 2017-08-20 | Discharge: 2017-08-24 | Disposition: A | Discharge status: Home / Self Care | Payer: BLUE CROSS/BLUE SHIELD | Attending: Family Medicine | Admitting: Family Medicine

## 2017-08-20 DIAGNOSIS — Z85828 Personal history of other malignant neoplasm of skin: Secondary | ICD-10-CM | POA: Insufficient documentation

## 2017-08-20 DIAGNOSIS — M5136 Other intervertebral disc degeneration, lumbar region: Secondary | ICD-10-CM | POA: Diagnosis not present

## 2017-08-20 DIAGNOSIS — M109 Gout, unspecified: Secondary | ICD-10-CM | POA: Diagnosis not present

## 2017-08-20 DIAGNOSIS — G8929 Other chronic pain: Secondary | ICD-10-CM | POA: Insufficient documentation

## 2017-08-20 DIAGNOSIS — Z7982 Long term (current) use of aspirin: Secondary | ICD-10-CM | POA: Insufficient documentation

## 2017-08-20 DIAGNOSIS — Z79899 Other long term (current) drug therapy: Secondary | ICD-10-CM | POA: Insufficient documentation

## 2017-08-20 DIAGNOSIS — M1A0220 Idiopathic chronic gout, left elbow, without tophus (tophi): Secondary | ICD-10-CM

## 2017-08-20 DIAGNOSIS — M79609 Pain in unspecified limb: Secondary | ICD-10-CM | POA: Diagnosis not present

## 2017-08-20 DIAGNOSIS — L0390 Cellulitis, unspecified: Principal | ICD-10-CM | POA: Diagnosis present

## 2017-08-20 DIAGNOSIS — Z96642 Presence of left artificial hip joint: Secondary | ICD-10-CM

## 2017-08-20 DIAGNOSIS — M1612 Unilateral primary osteoarthritis, left hip: Secondary | ICD-10-CM | POA: Diagnosis not present

## 2017-08-20 DIAGNOSIS — L03114 Cellulitis of left upper limb: Secondary | ICD-10-CM

## 2017-08-20 DIAGNOSIS — M5030 Other cervical disc degeneration, unspecified cervical region: Secondary | ICD-10-CM | POA: Insufficient documentation

## 2017-08-20 HISTORY — DX: Low back pain, unspecified: M54.50

## 2017-08-20 HISTORY — DX: Family history of other specified conditions: Z84.89

## 2017-08-20 HISTORY — DX: Unspecified osteoarthritis, unspecified site: M19.90

## 2017-08-20 HISTORY — DX: Other chronic pain: G89.29

## 2017-08-20 HISTORY — DX: Low back pain: M54.5

## 2017-08-20 HISTORY — DX: Basal cell carcinoma of skin, unspecified: C44.91

## 2017-08-20 LAB — CBC WITH DIFFERENTIAL/PLATELET
BASOS ABS: 0 10*3/uL (ref 0.0–0.1)
Basophils Relative: 0 %
EOS PCT: 0 %
Eosinophils Absolute: 0 10*3/uL (ref 0.0–0.7)
HCT: 29.6 % — ABNORMAL LOW (ref 39.0–52.0)
HEMOGLOBIN: 9.9 g/dL — AB (ref 13.0–17.0)
LYMPHS PCT: 9 %
Lymphs Abs: 1 10*3/uL (ref 0.7–4.0)
MCH: 32.2 pg (ref 26.0–34.0)
MCHC: 33.4 g/dL (ref 30.0–36.0)
MCV: 96.4 fL (ref 78.0–100.0)
MONOS PCT: 5 %
Monocytes Absolute: 0.5 10*3/uL (ref 0.1–1.0)
Neutro Abs: 9.2 10*3/uL — ABNORMAL HIGH (ref 1.7–7.7)
Neutrophils Relative %: 86 %
PLATELETS: 374 10*3/uL (ref 150–400)
RBC: 3.07 MIL/uL — AB (ref 4.22–5.81)
RDW: 13.1 % (ref 11.5–15.5)
WBC: 10.7 10*3/uL — AB (ref 4.0–10.5)

## 2017-08-20 LAB — BASIC METABOLIC PANEL
Anion gap: 9 (ref 5–15)
BUN: 19 mg/dL (ref 6–20)
CALCIUM: 9.1 mg/dL (ref 8.9–10.3)
CO2: 24 mmol/L (ref 22–32)
Chloride: 99 mmol/L — ABNORMAL LOW (ref 101–111)
Creatinine, Ser: 0.76 mg/dL (ref 0.61–1.24)
GFR calc Af Amer: >60 mL/min (ref >60)
GLUCOSE: 130 mg/dL — AB (ref 65–99)
Potassium: 3.4 mmol/L — ABNORMAL LOW (ref 3.5–5.1)
Sodium: 132 mmol/L — ABNORMAL LOW (ref 135–145)

## 2017-08-20 LAB — SEDIMENTATION RATE: Sed Rate: 35 mm/hr — ABNORMAL HIGH (ref 0–16)

## 2017-08-20 IMAGING — CR DG ELBOW COMPLETE 3+V*L*
4 series · 4 of 4 positions shown · non-contrast
Comparison: None.

CLINICAL DATA: Left elbow pain.

EXAM:
LEFT ELBOW - COMPLETE 3+ VIEW

[elbow ap]
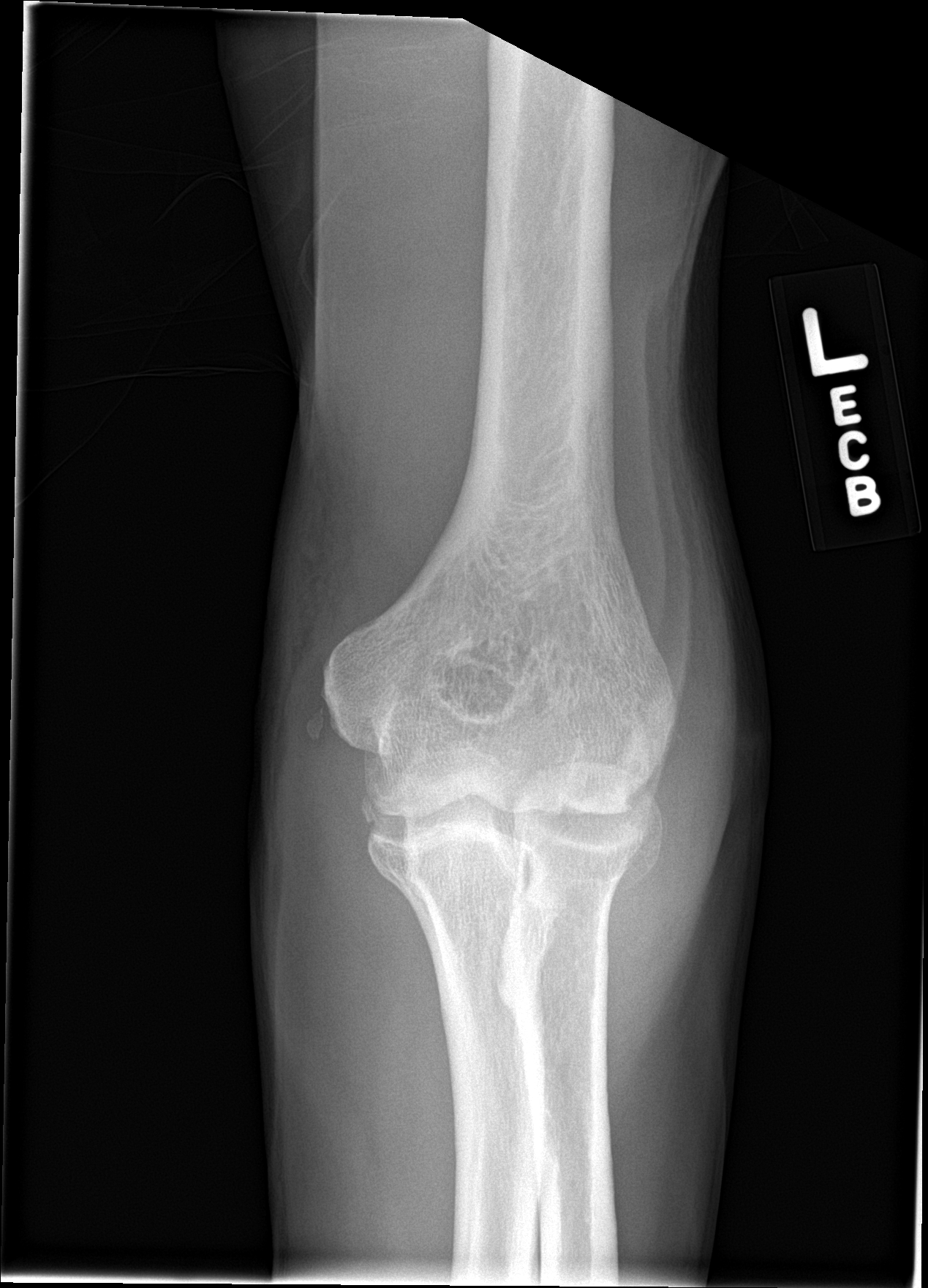

[elbow obl (1 of 2)]
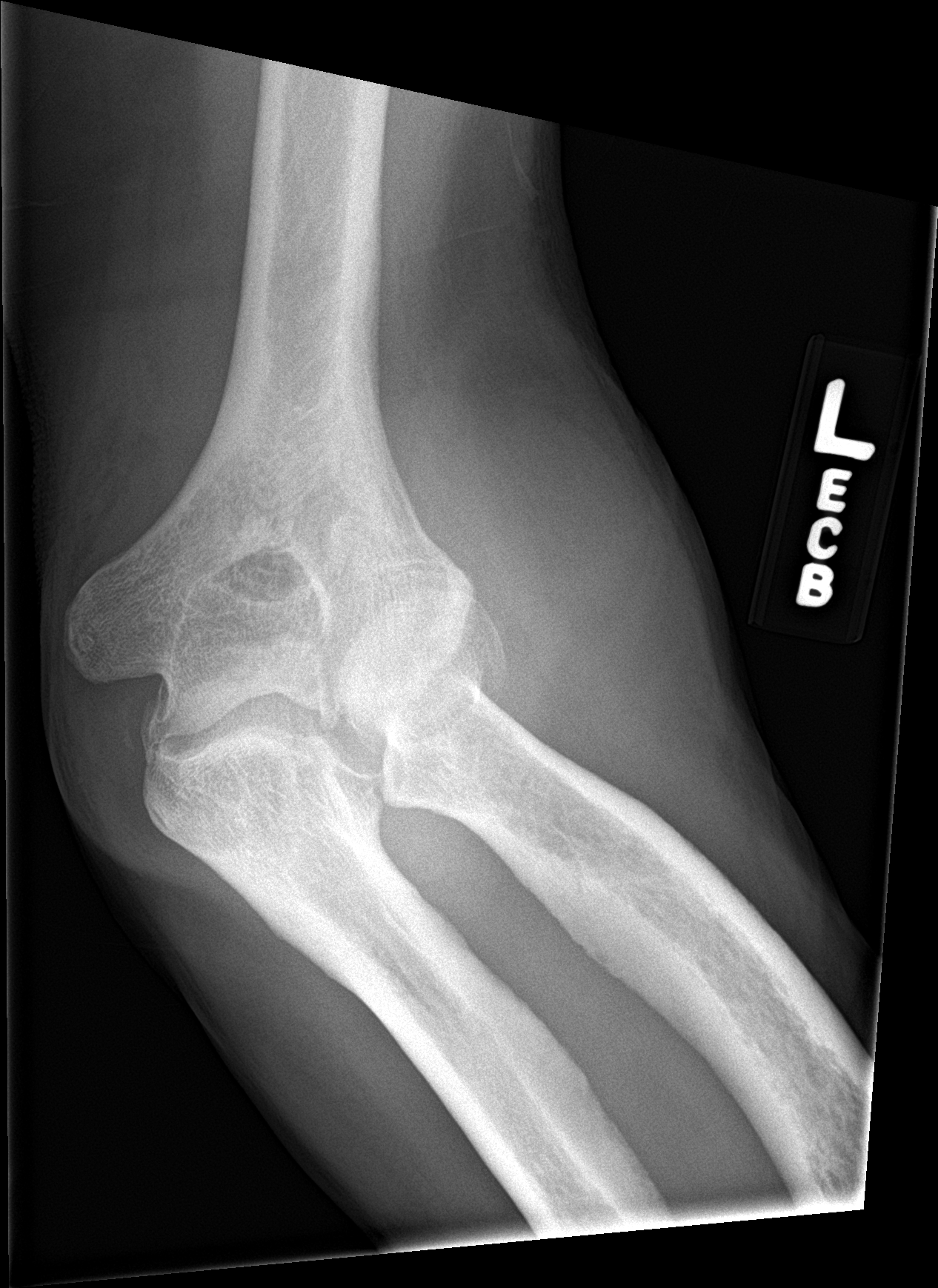

[elbow obl (2 of 2)]
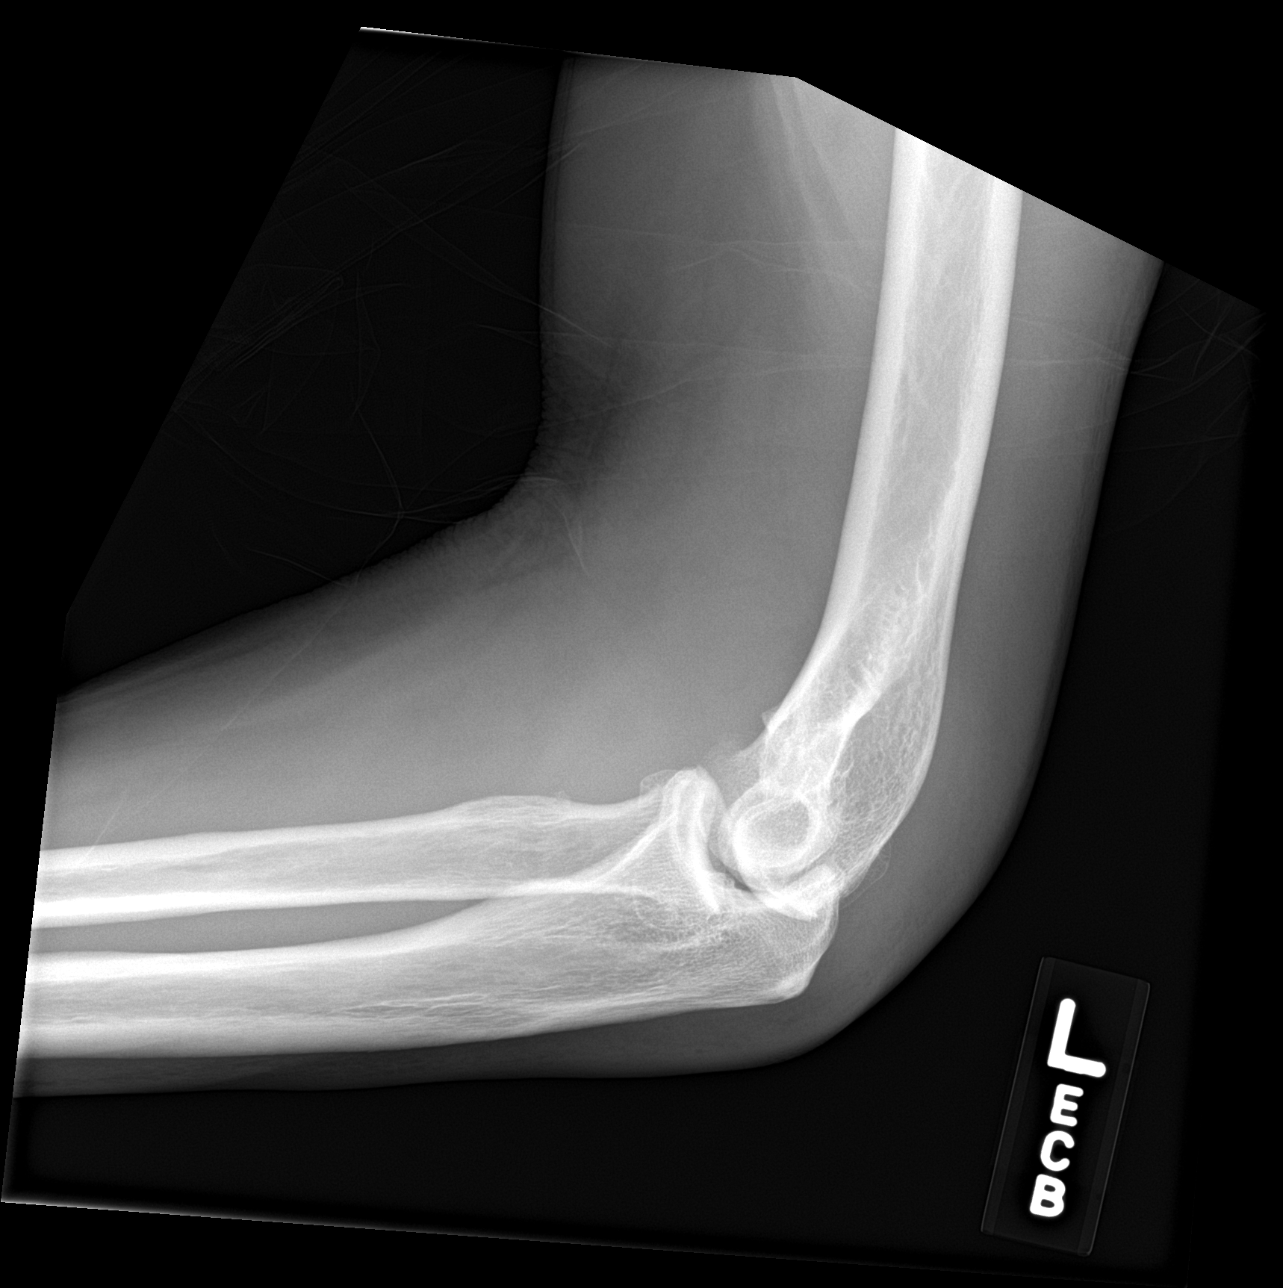

[elbow lat]
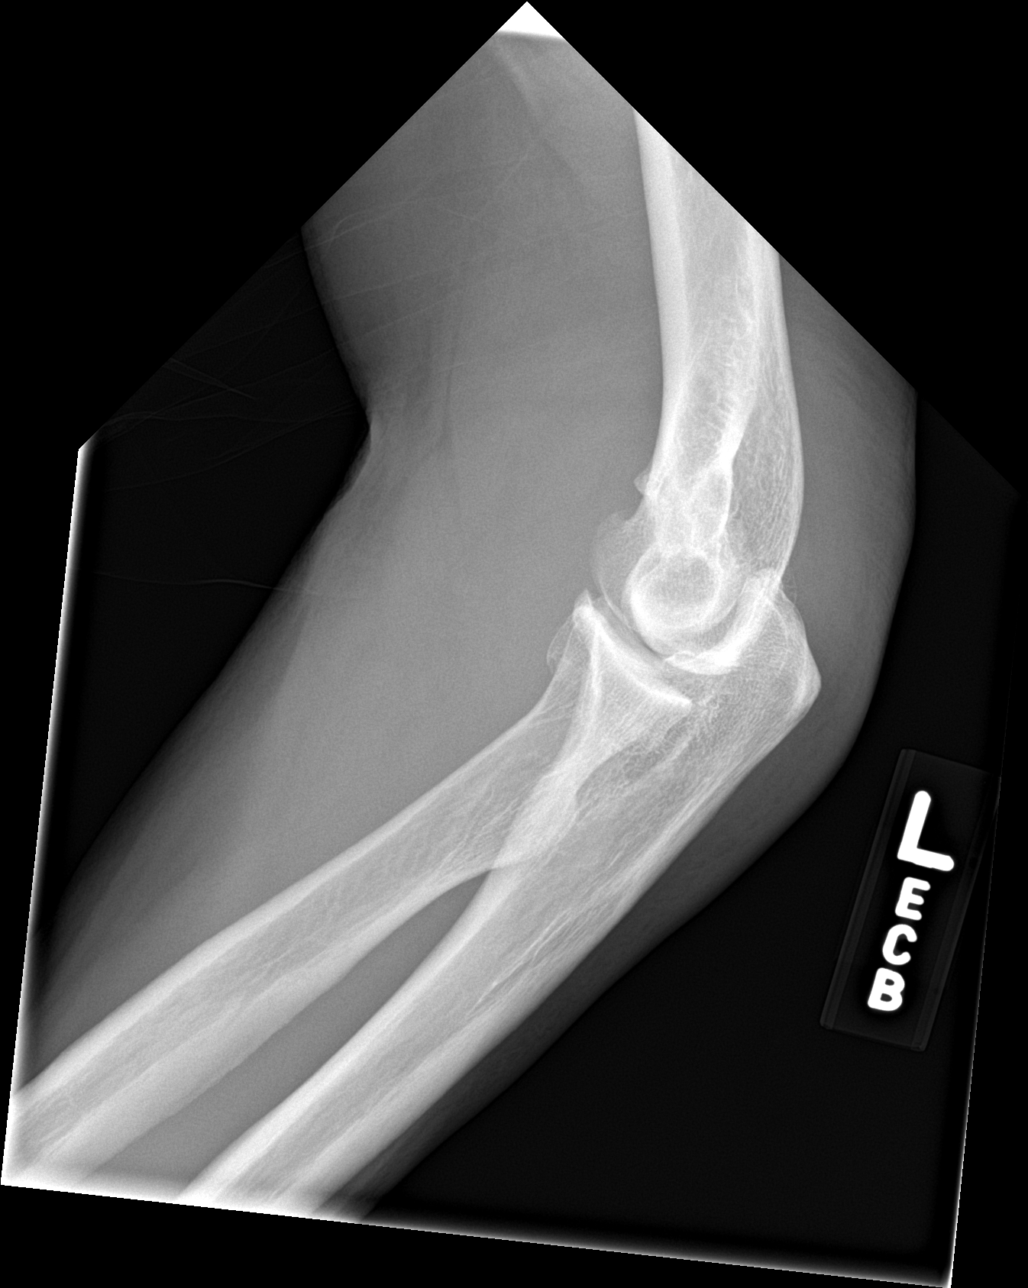

[4 of 4 positions shown; findings below may reference images not displayed]

FINDINGS: Study limited by suboptimal positioning. Within this limitation, no
gross fracture or dislocation is evident. Degenerative changes are
noted. There is soft tissue swelling around the elbow although no
definite fat pad elevation can be identified on the limited lateral
projection.
IMPRESSION: Degenerative changes without evidence for an acute fracture or
dislocation.

Soft tissue swelling without definite fat pad elevation.

## 2017-08-20 IMAGING — DX DG WRIST COMPLETE 3+V*L*
4 series · 4 of 4 positions shown · non-contrast
Comparison: None in PACs

CLINICAL DATA: Wrist pain with no known injury. The patient
awakened this morning with wrist pain. History of previous wrist
fracture.

EXAM:
LEFT WRIST - COMPLETE 3+ VIEW

[wrist pa]
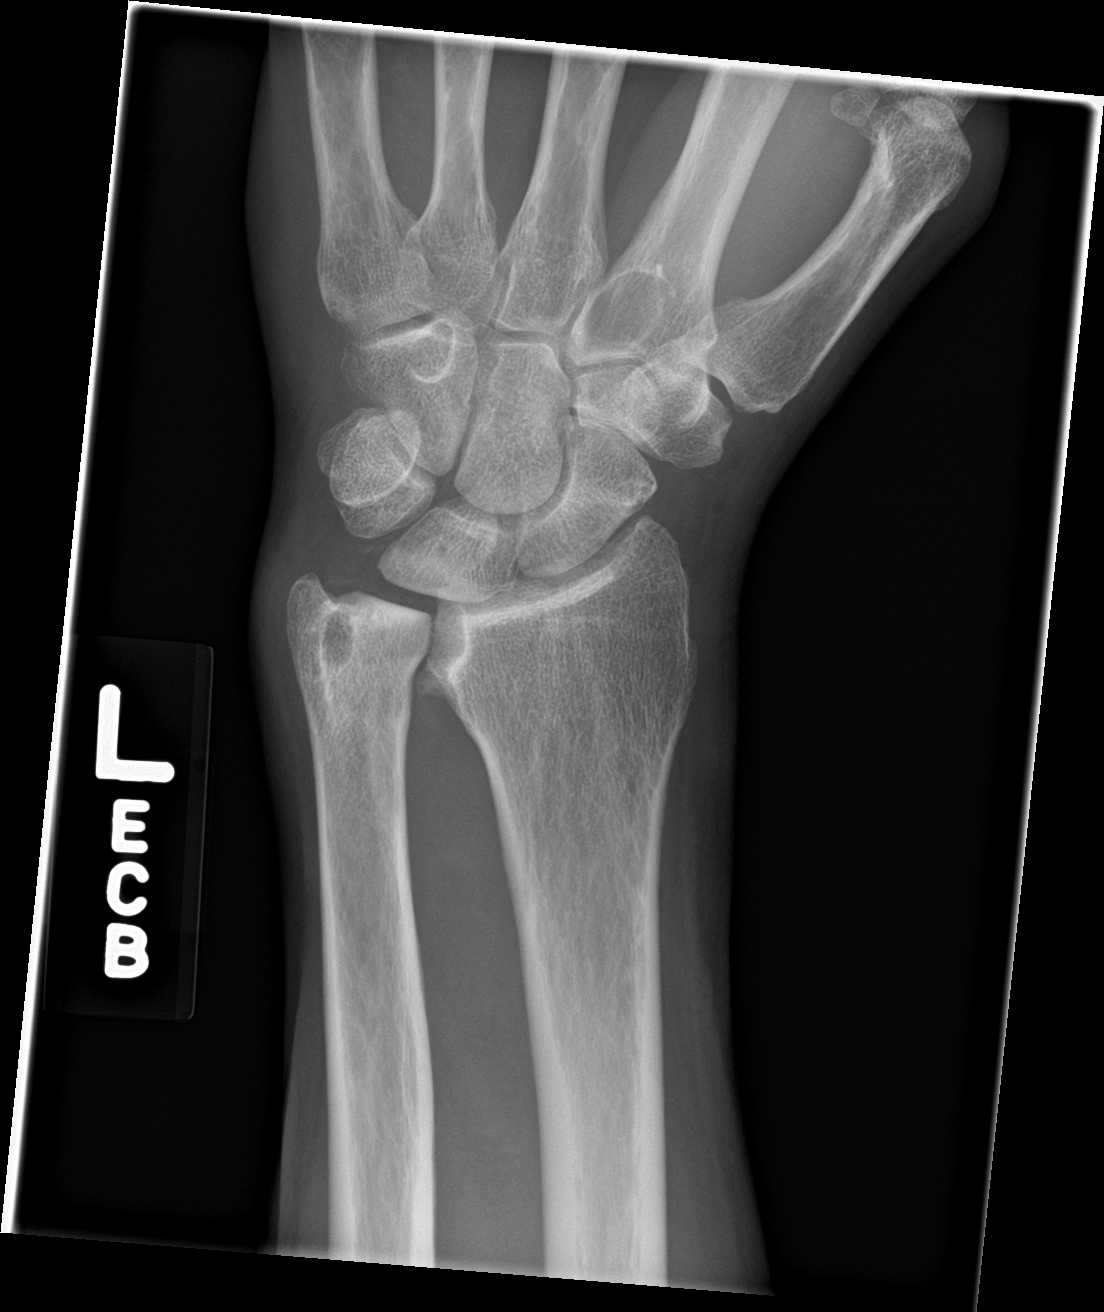

[wrist obl]
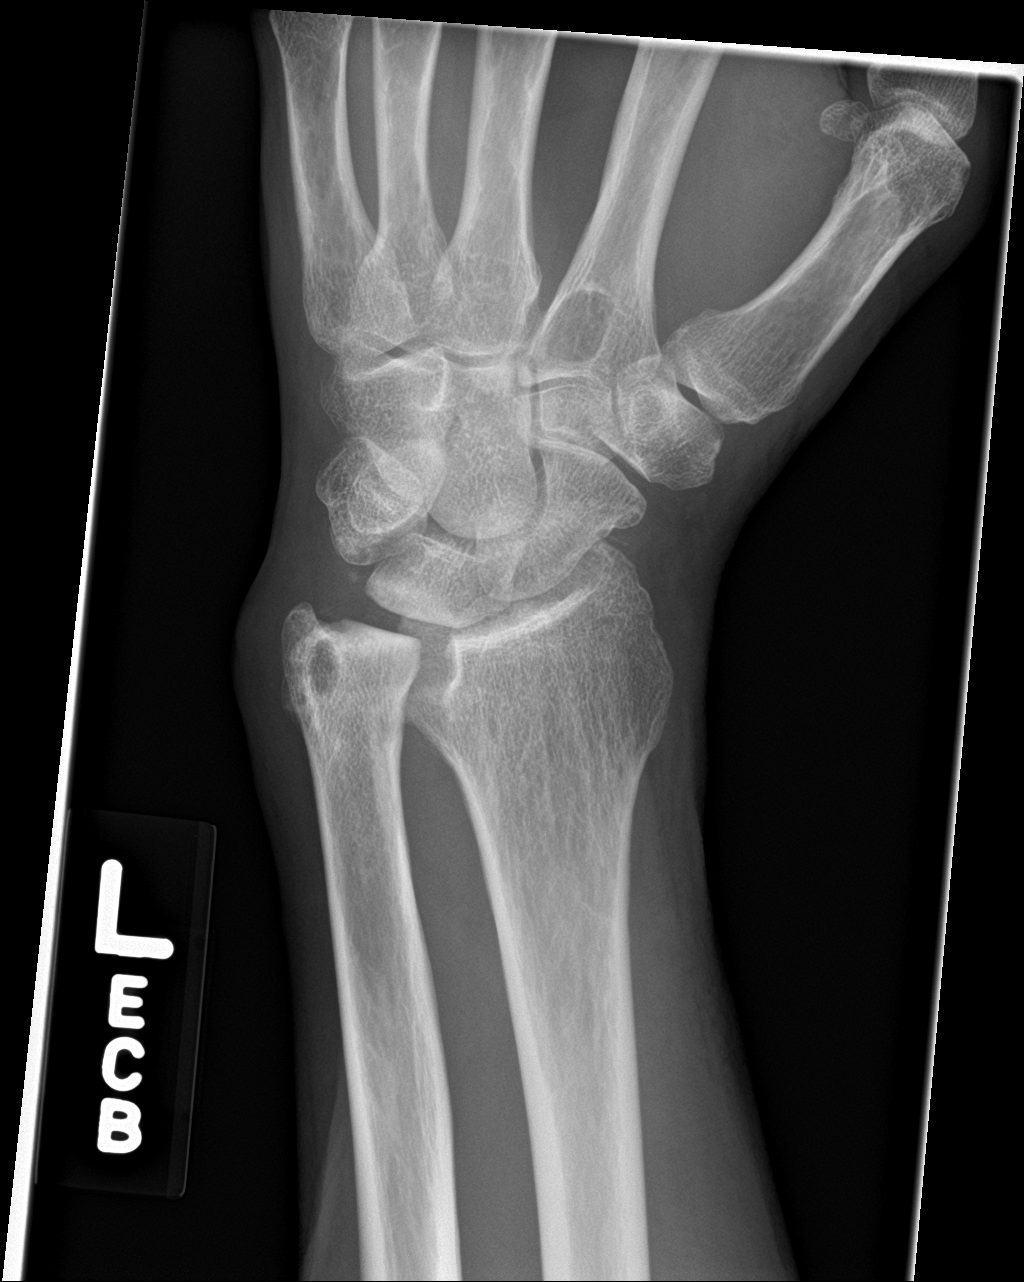

[wrist lat]
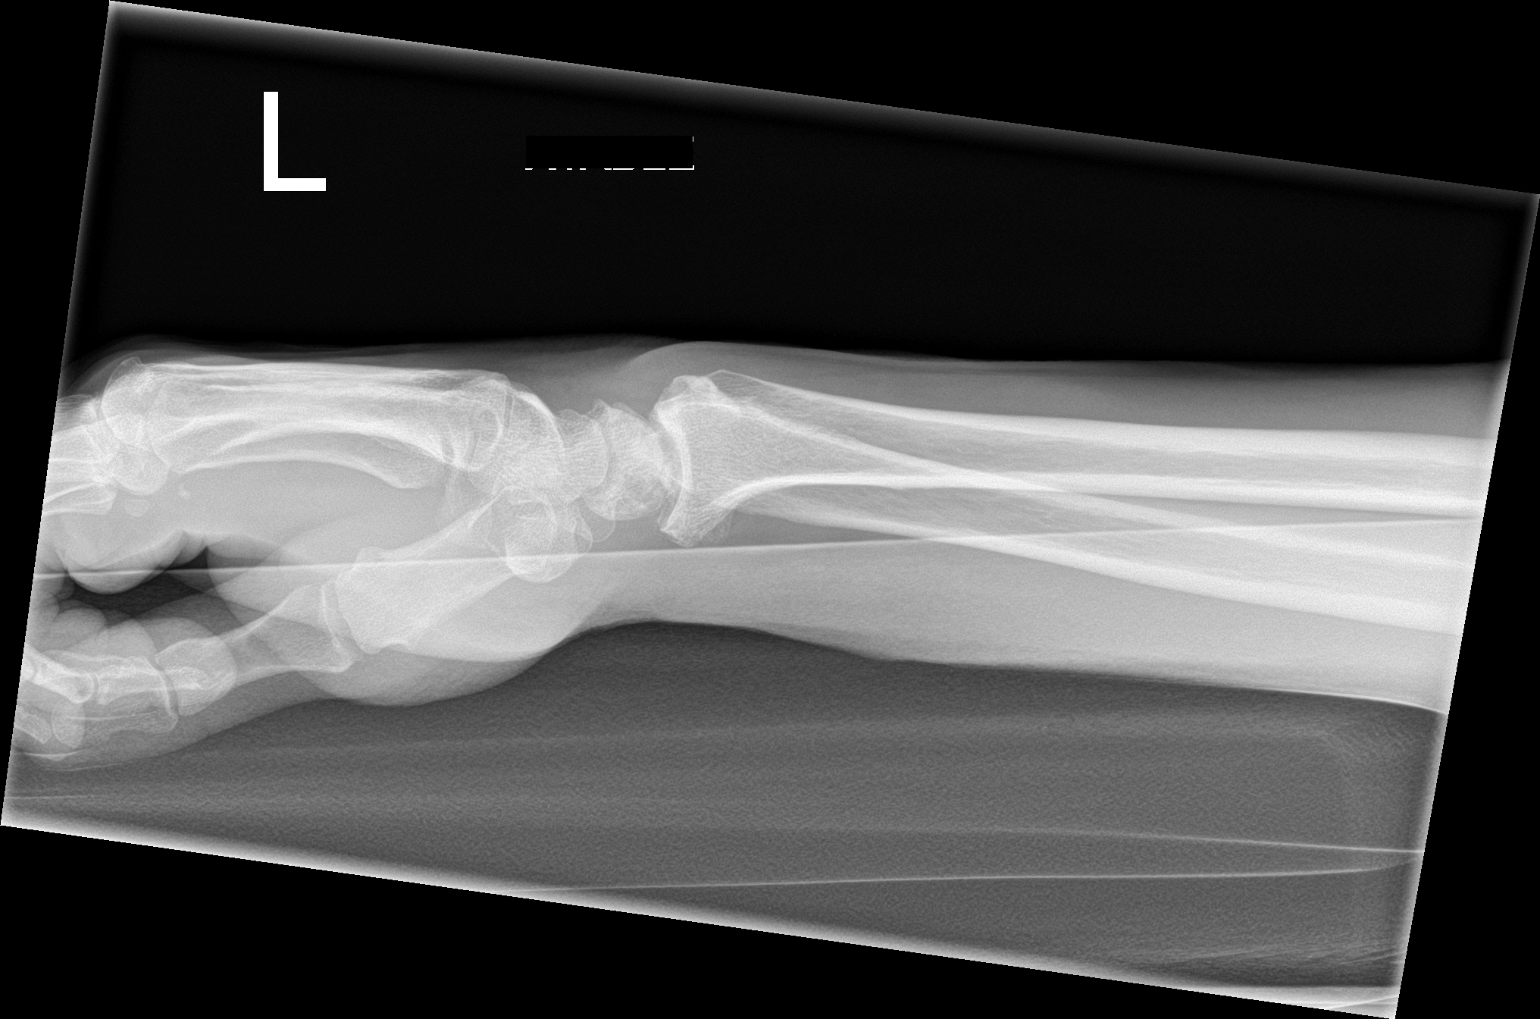

[wrist navicular]
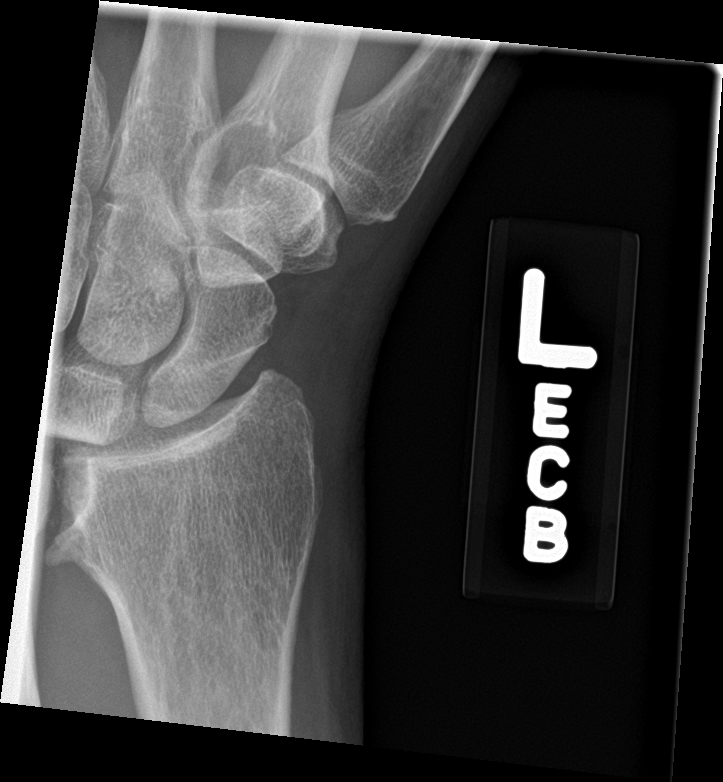

[4 of 4 positions shown; findings below may reference images not displayed]

FINDINGS: The bones are subjectively adequately mineralized. There is a
well-circumscribed ovoid subcentimeter lucency in the distal ulna.
There is a similar-appearing but larger well-circumscribed lucency
in the base of the second metacarpal. The joint spaces are well
maintained. There is no acute or healing fracture. There is some
irregularity of the radiocarpal articulation. The soft tissues
exhibit no acute abnormality. Specific attention to the scaphoid
reveals no abnormality.
IMPRESSION: No significant arthropathy nor evidence of acute fracture of the
wrist. There is mild spurring of the radioulnar articulation
consistent with mild osteoarthritis. Well-circumscribed
benign-appearing cystic changes in the base of the second metacarpal
and in the distal ulna.

## 2017-08-20 MED ORDER — SODIUM CHLORIDE 0.9 % IV SOLN
INTRAVENOUS | Status: DC
  Administered 2017-08-20 – 2017-08-23 (×6): via INTRAVENOUS

## 2017-08-20 MED ORDER — ASPIRIN EC 325 MG PO TBEC
325.0000 mg | DELAYED_RELEASE_TABLET | Freq: Every day | ORAL | Status: DC
  Administered 2017-08-21 – 2017-08-23 (×3): 325 mg via ORAL
  Filled 2017-08-20 (×3): qty 1

## 2017-08-20 MED ORDER — METHOCARBAMOL 500 MG PO TABS
500.0000 mg | ORAL_TABLET | Freq: Four times a day (QID) | ORAL | Status: DC | PRN
  Administered 2017-08-21 – 2017-08-23 (×6): 500 mg via ORAL
  Filled 2017-08-20 (×7): qty 1

## 2017-08-20 MED ORDER — VITAMIN B-12 1000 MCG PO TABS
1000.0000 ug | ORAL_TABLET | Freq: Every day | ORAL | Status: DC
  Administered 2017-08-21 – 2017-08-23 (×3): 1000 ug via ORAL
  Filled 2017-08-20 (×3): qty 1

## 2017-08-20 MED ORDER — ONDANSETRON HCL 4 MG PO TABS: 4.0000 mg | ORAL_TABLET | Freq: Four times a day (QID) | ORAL | Status: DC | PRN

## 2017-08-20 MED ORDER — ACETAMINOPHEN 325 MG PO TABS
650.0000 mg | ORAL_TABLET | Freq: Four times a day (QID) | ORAL | Status: DC | PRN
  Administered 2017-08-21 – 2017-08-23 (×2): 650 mg via ORAL
  Filled 2017-08-20 (×2): qty 2

## 2017-08-20 MED ORDER — SENNA 8.6 MG PO TABS
1.0000 | ORAL_TABLET | Freq: Two times a day (BID) | ORAL | Status: DC
  Administered 2017-08-20 – 2017-08-23 (×7): 8.6 mg via ORAL
  Filled 2017-08-20 (×7): qty 1

## 2017-08-20 MED ORDER — VANCOMYCIN HCL IN DEXTROSE 750-5 MG/150ML-% IV SOLN
750.0000 mg | Freq: Two times a day (BID) | INTRAVENOUS | Status: DC
  Administered 2017-08-21 – 2017-08-23 (×5): 750 mg via INTRAVENOUS
  Filled 2017-08-20 (×6): qty 150

## 2017-08-20 MED ORDER — POLYETHYLENE GLYCOL 3350 17 G PO PACK: 17.0000 g | PACK | Freq: Every day | ORAL | Status: DC | PRN

## 2017-08-20 MED ORDER — ZINC GLUCONATE 50 MG PO TABS: 25.0000 mg | ORAL_TABLET | Freq: Every day | ORAL | Status: DC

## 2017-08-20 MED ORDER — VANCOMYCIN HCL IN DEXTROSE 1-5 GM/200ML-% IV SOLN
1000.0000 mg | INTRAVENOUS | Status: AC
  Administered 2017-08-20: 1000 mg via INTRAVENOUS
  Filled 2017-08-20: qty 200

## 2017-08-20 MED ORDER — LORAZEPAM 2 MG/ML IJ SOLN
1.0000 mg | Freq: Once | INTRAMUSCULAR | Status: AC
  Administered 2017-08-20: 1 mg via INTRAVENOUS
  Filled 2017-08-20: qty 1

## 2017-08-20 MED ORDER — ZINC SULFATE 220 (50 ZN) MG PO CAPS
220.0000 mg | ORAL_CAPSULE | Freq: Every day | ORAL | Status: DC
  Administered 2017-08-20 – 2017-08-23 (×4): 220 mg via ORAL
  Filled 2017-08-20 (×4): qty 1

## 2017-08-20 MED ORDER — ENOXAPARIN SODIUM 40 MG/0.4ML ~~LOC~~ SOLN
40.0000 mg | SUBCUTANEOUS | Status: DC
  Administered 2017-08-20 – 2017-08-23 (×4): 40 mg via SUBCUTANEOUS
  Filled 2017-08-20 (×4): qty 0.4

## 2017-08-20 MED ORDER — ONDANSETRON HCL 4 MG/2ML IJ SOLN: 4.0000 mg | Freq: Four times a day (QID) | INTRAMUSCULAR | Status: DC | PRN

## 2017-08-20 MED ORDER — OXYCODONE-ACETAMINOPHEN 5-325 MG PO TABS
1.0000 | ORAL_TABLET | ORAL | Status: DC | PRN
  Administered 2017-08-20 – 2017-08-23 (×7): 2 via ORAL
  Administered 2017-08-24: 1 via ORAL
  Filled 2017-08-20 (×9): qty 2

## 2017-08-20 MED ORDER — KETOROLAC TROMETHAMINE 30 MG/ML IJ SOLN
30.0000 mg | Freq: Four times a day (QID) | INTRAMUSCULAR | Status: DC | PRN
  Administered 2017-08-21 – 2017-08-23 (×3): 30 mg via INTRAVENOUS
  Filled 2017-08-20 (×3): qty 1

## 2017-08-20 MED ORDER — GLUCOSAMINE SULFATE 500 MG PO CAPS: ORAL_CAPSULE | Freq: Every day | ORAL | Status: DC

## 2017-08-20 MED ORDER — SODIUM CHLORIDE 0.9% FLUSH
3.0000 mL | Freq: Two times a day (BID) | INTRAVENOUS | Status: DC
  Administered 2017-08-23 (×2): 3 mL via INTRAVENOUS

## 2017-08-20 MED ORDER — ACETAMINOPHEN 650 MG RE SUPP: 650.0000 mg | Freq: Four times a day (QID) | RECTAL | Status: DC | PRN

## 2017-08-20 MED ORDER — FLUTICASONE PROPIONATE 50 MCG/ACT NA SUSP
1.0000 | Freq: Every day | NASAL | Status: DC
  Administered 2017-08-21 – 2017-08-23 (×3): 1 via NASAL
  Filled 2017-08-20: qty 16

## 2017-08-20 MED ORDER — VITAMIN B-6 100 MG PO TABS
50.0000 mg | ORAL_TABLET | Freq: Every day | ORAL | Status: DC
  Administered 2017-08-21 – 2017-08-23 (×3): 50 mg via ORAL
  Filled 2017-08-20 (×3): qty 1

## 2017-08-20 MED ORDER — HYDROMORPHONE HCL 1 MG/ML IJ SOLN
0.5000 mg | Freq: Once | INTRAMUSCULAR | Status: AC
  Administered 2017-08-20: 0.5 mg via INTRAVENOUS
  Filled 2017-08-20: qty 1

## 2017-08-20 MED ORDER — ZOLPIDEM TARTRATE 5 MG PO TABS: 5.0000 mg | ORAL_TABLET | Freq: Every evening | ORAL | Status: DC | PRN

## 2017-08-20 MED ORDER — ONDANSETRON HCL 4 MG/2ML IJ SOLN
4.0000 mg | Freq: Once | INTRAMUSCULAR | Status: AC
  Administered 2017-08-20: 4 mg via INTRAVENOUS
  Filled 2017-08-20: qty 2

## 2017-08-20 NOTE — Progress Notes (Signed)
PHARMACIST - PHYSICIAN ORDER COMMUNICATION  CONCERNING: P&T Medication Policy on Herbal Medications  DESCRIPTION:  This patient's order for:  Glucosamine Sulfate  has been noted.  This product(s) is classified as an "herbal" or natural product. Due to a lack of definitive safety studies or FDA approval, nonstandard manufacturing practices, plus the potential risk of unknown drug-drug interactions while on inpatient medications, the Pharmacy and Therapeutics Committee does not permit the use of "herbal" or natural products of this type within [facility].   ACTION TAKEN: The pharmacy department is unable to verify this order at this time and your patient has been informed of this safety policy. Please reevaluate patient's clinical condition at discharge and address if the herbal or natural product(s) should be resumed at that time.  Alycia Rossetti, PharmD, BCPS Pager: [phone] 7:25 PM

## 2017-08-20 NOTE — Progress Notes (Signed)
Pharmacy Antibiotic Note  Danny Bender is a 61 y.o. male admitted on 08/20/2017 with L-arm pain concerning for septic joint versus cellulitis.  Pharmacy has been consulted for Vancomycin dosing.  Plan: 1. Vancomycin 1g IV x 1 dose now followed by 750 mg IV every 12 hours 2. Will continue to follow renal function, culture results, LOT, and antibiotic de-escalation plans   Height: 5\' 8"  (172.7 cm) Weight: 148 lb (67.1 kg) IBW/kg (Calculated) : 68.4  Temp (24hrs), Avg:98.8 F (37.1 C), Min:98.8 F (37.1 C), Max:98.8 F (37.1 C)  Recent Labs  Lab 08/20/17 1324  WBC 10.7*  CREATININE 0.76    Estimated Creatinine Clearance: 89.7 mL/min (by C-G formula based on SCr of 0.76 mg/dL).    No Known Allergies  Antimicrobials this admission: Vanc 12/31 >>  Dose adjustments this admission: n/a  Microbiology results:  Thank you for allowing pharmacy to be a part of this patient's care.  Alycia Rossetti, PharmD, BCPS Clinical Pharmacist Pager: [phone] Clinical phone for 08/20/2017 : [phone] If after 3:30p, please call main pharmacy at: [phone] 08/20/2017 5:09 PM

## 2017-08-20 NOTE — ED Notes (Signed)
Pt arrives back from Xray and was unable to tolerate elbow XRAY and when he arrived back he was angry and stating he would just like to go home. This RN tried to talk with patient and persuade him to stay and get his [place]. PA Roselyn Reef is at bedside at this time to speak with patient.

## 2017-08-20 NOTE — ED Notes (Signed)
Report given to [facility] RN on 6N.

## 2017-08-20 NOTE — ED Notes (Signed)
ED Provider at bedside. 

## 2017-08-20 NOTE — H&P (Signed)
History and Physical  Danny Bender VPX:[id number] DOB: [birth year] DOA: 08/20/2017  Referring physician: ED PCP: [name], MD  Outpatient Specialists: Dr [name] Patient coming from: home & is able to ambulate with a walker  Chief Complaint: L elbow pain and L wrist pain  HPI: Danny Bender is a 61 y.o. 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 Pt reported pain starting overnight, L elbow with erythema and swelling. Pt denies any falls, insect bite or injury to elbow. Pt reported pain is 10/10, no radiation, exacerbated by touching. Pt also reported associated L wrist pain. Reported chills, denied any fever, abdominal pain, dysuria, chest pain, SOB, N/V/D/C. Pt also noted to have LLE extremity swelling, which has been present since post op as per pt.  ED Course: In the ED Xray elbow/wrist shows degenerative changes without evidence for an acute fracture or dislocation. Soft tissue swelling without definite fat pad elevation. Started on IV Vanc   Review of System Review of systems are otherwise negative   Past Medical History:  Diagnosis Date  . Cancer ([place])    basal cell cancer  . Chronic back pain   . Complication of anesthesia    took days to clear head  . DDD (degenerative disc disease), cervical   . DDD (degenerative disc disease), lumbar   . Discoloration of skin    fingers and toe   . Headache   . History of shingles   . Nasal polyps   . OA (osteoarthritis) of hip    Left, l wrist  . Ruptured disk    lumbar   Past Surgical History:  Procedure Laterality Date  . HERNIA REPAIR  2004  . neck fusion    . TOTAL HIP ARTHROPLASTY Left 08/10/2017   Procedure: LEFT TOTAL HIP ARTHROPLASTY ANTERIOR APPROACH;  Surgeon: Mcarthur Rossetti, MD;  Location: [facility];  Service: Orthopedics;  Laterality: Left;    Social History:   reports that  has never smoked. he has never used smokeless tobacco. He reports that he drinks alcohol. He reports that he uses drugs. Drug: Marijuana.   No Known Allergies  No family history on file.  None  Prior to Admission medications   Medication Sig Start Date End Date Taking? Authorizing Provider  aspirin EC 325 MG EC tablet Take 1 tablet (325 mg total) by mouth daily with breakfast. 08/11/17  Yes Mcarthur Rossetti, MD  fluticasone [facility]) 50 MCG/ACT nasal spray Place 1 spray into both nostrils daily.   Yes [provider]  GLUCOSAMINE SULFATE PO Take 1 tablet by mouth daily.   Yes [provider]  methocarbamol (ROBAXIN) 500 MG tablet Take 1 tablet (500 mg total) by mouth every 6 (six) hours as needed for muscle spasms. 08/11/17  Yes Mcarthur Rossetti, MD  milk thistle 175 MG tablet Take 175 mg by mouth daily.   Yes [provider]  oxyCODONE-acetaminophen (ROXICET) 5-325 MG tablet Take 1-2 tablets by mouth every 4 (four) hours as needed. 08/11/17  Yes Mcarthur Rossetti, MD  pyridoxine (B-6) 100 MG tablet Take 50 mg by mouth daily.   Yes [provider]  vitamin B-12 (CYANOCOBALAMIN) 1000 MCG tablet Take 1,000 mcg by mouth daily.   Yes [provider]  zinc gluconate 50 MG tablet Take 25 mg by mouth daily.   Yes [provider]  Physical Exam: BP 139/64 (BP Location: Right Arm)   Pulse 66   Temp 99 F (37.2 C) (Oral)   Resp 18   Ht 5\' 8"  (1.727 m)   Wt 69.3 kg (152 lb 12.5 oz)   SpO2 97%   BMI 23.23 kg/m   General:  Alert, awake, oriented, NAD  Eyes: Normal  ENT: Normal  Neck: Normal Cardiovascular: S1-S2 present, no added hrt sound Respiratory: Chest clear bilaterally Abdomen: Soft, non-tender, non-distended, BS present Skin: Normal  Musculoskeletal: L elbow pain, swelling, erythema, limited ROM. L wrist pain, LLE swelling, limited ROM Psychiatric: Normal mood  Neurologic: No focal  neurologic deficit          Labs on Admission:  Basic Metabolic Panel: Recent Labs  Lab 08/20/17 1324  NA 132*  K 3.4*  CL 99*  CO2 24  GLUCOSE 130*  BUN 19  CREATININE 0.76  CALCIUM 9.1   Liver Function Tests: No results for input(s): AST, ALT, ALKPHOS, BILITOT, PROT, ALBUMIN in the last 168 hours. No results for input(s): LIPASE, AMYLASE in the last 168 hours. No results for input(s): AMMONIA in the last 168 hours. CBC: Recent Labs  Lab 08/20/17 1324  WBC 10.7*  NEUTROABS 9.2*  HGB 9.9*  HCT 29.6*  MCV 96.4  PLT 374   Cardiac Enzymes: No results for input(s): CKTOTAL, CKMB, CKMBINDEX, TROPONINI in the last 168 hours.  BNP (last 3 results) No results for input(s): BNP in the last 8760 hours.  ProBNP (last 3 results) No results for input(s): PROBNP in the last 8760 hours.  CBG: No results for input(s): GLUCAP in the last 168 hours.  Radiological Exams on Admission: Dg Elbow Complete Left  Result Date: 08/20/2017 CLINICAL DATA:  Left elbow pain. EXAM: LEFT ELBOW - COMPLETE 3+ VIEW COMPARISON:  None. FINDINGS: Study limited by suboptimal positioning. Within this limitation, no gross fracture or dislocation is evident. Degenerative changes are noted. There is soft tissue swelling around the elbow although no definite fat pad elevation can be identified on the limited lateral projection. IMPRESSION: Degenerative changes without evidence for an acute fracture or dislocation. Soft tissue swelling without definite fat pad elevation. Electronically Signed   By: Misty Stanley M.D.   On: 08/20/2017 16:18   Dg Wrist Complete Left  Result Date: 08/20/2017 CLINICAL DATA:  Wrist pain with no known injury. The patient awakened this morning with wrist pain. History of previous wrist fracture. EXAM: LEFT WRIST - COMPLETE 3+ VIEW COMPARISON:  None in PACs FINDINGS: The bones are subjectively adequately mineralized. There is a well-circumscribed ovoid subcentimeter lucency in the  distal ulna. There is a similar-appearing but larger well-circumscribed lucency in the base of the second metacarpal. The joint spaces are well maintained. There is no acute or healing fracture. There is some irregularity of the radiocarpal articulation. The soft tissues exhibit no acute abnormality. Specific attention to the scaphoid reveals no abnormality. IMPRESSION: No significant arthropathy nor evidence of acute fracture of the wrist. There is mild spurring of the radioulnar articulation consistent with mild osteoarthritis. Well-circumscribed benign-appearing cystic changes in the base of the second metacarpal and in the distal ulna. Electronically Signed   By: David  Martinique M.D.   On: 08/20/2017 14:10    Assessment/Plan Present on Admission: . Cellulitis . Unilateral primary osteoarthritis, left hip  Principal Problem:   Cellulitis Active Problems:   Unilateral primary osteoarthritis, left hip   Status post total replacement of left hip  #L elbow/knee pain/swelling Likely cellulitis, r/o  septic arthritis Vs Gout Afebrile, mild leukocytosis Elbow xray as above Duplex of elbow: neg for DVT BC X 2 pending Uric acid pending Start IV Vancomycin IVF Ortho on consult, for possible athrocentesis  #Normocytic anemia Likely post op Daily CBC  #LLE swelling s/p L total hip replacement on 08/10/17 by Dr [name] consulted   DVT prophylaxis: Lovenox  Code Status: Full   Family Communication: Discussed with brother at bedside  Disposition Plan: Home once stable   Consults called: Orthopedics  Admission status: Observation     Alma Friendly MD [facility] Pager [phone]  If 7PM-7AM, please contact night-coverage [email or web address] Password [email or web address]  08/20/2017, 10:11 PM

## 2017-08-20 NOTE — Progress Notes (Signed)
*  PRELIMINARY RESULTS* Vascular Ultrasound Left upper extremity venous duplex has been completed.  Preliminary findings: The visualized veins of the left upper extremity appear negative for deep and superficial vein thrombosis.  Everrett Coombe 08/20/2017, 4:33 PM

## 2017-08-20 NOTE — ED Provider Notes (Addendum)
Danny Bender [facility]  SURGICAL Provider Note   CSN: [id number] Arrival date & time: 08/20/17  1230     History   Chief Complaint Chief Complaint  Patient presents with  . Arm Pain    HPI Danny Bender is a 61 y.o. male.  The history is provided by the patient and medical records. No language interpreter was used.   Danny Bender is a 61 y.o. male  with a PMH of total hip replacement by Dr. [name] on 08/10/2017 who presents to the Emergency Department complaining of acute onset of left elbow and wrist pain which began last night and has been progressively worsening throughout the day. Associated with swelling and redness to the left elbow. Given 158mg Fentanyl prior to arrival and feels like it helped, however pain has now returned. Denies fevers but did feel like he had the chills earlier.   Past Medical History:  Diagnosis Date  . Arthritis    "shoulders, hands, left wrist" (08/22/2017)  . Basal cell carcinoma    "used nitrogen chin, ?right" (08/22/2017)  . Chronic back pain   . Chronic lower back pain   . Complication of anesthesia    took days to clear head  . DDD (degenerative disc disease), cervical   . DDD (degenerative disc disease), lumbar   . Discoloration of skin    fingers and toe   . Family history of adverse reaction to anesthesia    "sister had a problem; don't remember what it was"  . History of shingles   . Nasal polyps   . OA (osteoarthritis) of hip   . Ruptured disk    lumbar    Patient Active Problem List   Diagnosis Date Noted  . 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 Cellulitis 08/20/2017  . Status post total replacement of left hip 08/10/2017  . Pain in left hip 04/24/2017  . Unilateral primary osteoarthritis, left hip 04/24/2017    Past Surgical History:  Procedure Laterality Date  . ABDOMINAL HERNIA REPAIR  2004  . ANTERIOR CERVICAL DECOMP/DISCECTOMY FUSION  1991  . BACK SURGERY    . HERNIA  REPAIR    . JOINT REPLACEMENT    . TOTAL HIP ARTHROPLASTY Left 08/10/2017   Procedure: LEFT TOTAL HIP ARTHROPLASTY ANTERIOR APPROACH;  Surgeon: BMcarthur Rossetti MD;  Location: [facility];  Service: Orthopedics;  Laterality: Left;       Home Medications    Prior to Admission medications   Medication Sig Start Date End Date Taking? Authorizing Provider  aspirin EC 325 MG EC tablet Take 1 tablet (325 mg total) by mouth daily with breakfast. 08/11/17  Yes BMcarthur Rossetti MD  fluticasone ([facility] 50 MCG/ACT nasal spray Place 1 spray into both nostrils daily.   Yes [provider]  GLUCOSAMINE SULFATE PO Take 1 tablet by mouth daily.   Yes [provider]  methocarbamol (ROBAXIN) 500 MG tablet Take 1 tablet (500 mg total) by mouth every 6 (six) hours as needed for muscle spasms. 08/11/17  Yes BMcarthur Rossetti MD  milk thistle 175 MG tablet Take 175 mg by mouth daily.   Yes [provider]  oxyCODONE-acetaminophen (ROXICET) 5-325 MG tablet Take 1-2 tablets by mouth every 4 (four) hours as needed. 08/11/17  Yes BMcarthur Rossetti MD  pyridoxine (B-6) 100 MG tablet Take 50 mg by mouth daily.   Yes [provider]  vitamin B-12 (CYANOCOBALAMIN) 1000 MCG tablet Take 1,000  mcg by mouth daily.   Yes [provider]  zinc gluconate 50 MG tablet Take 25 mg by mouth daily.   Yes [provider]    Family History History reviewed. No pertinent family history.  Social History Social History   Tobacco Use  . Smoking status: Former Smoker    Packs/day: 2.00    Years: 10.00    Pack years: 20.00    Types: Cigarettes    Last attempt to quit: 08/21/2002    Years since quitting: 15.0  . Smokeless tobacco: Never Used  Substance Use Topics  . Alcohol use: Yes    Alcohol/week: 4.2 oz    Types: 7 Standard drinks or equivalent per week  . Drug use: Yes    Types: Marijuana    Comment: 08/22/2017 "just started recently; to  help w/pain"     Allergies   Patient has no known allergies.   Review of Systems Review of Systems  Constitutional: Positive for chills.  Musculoskeletal: Positive for arthralgias, joint swelling and myalgias.  Neurological: Negative for weakness and numbness.  All other systems reviewed and are negative.    Physical Exam Updated Vital Signs BP (!) 126/50 (BP Location: Right Leg)   Pulse (!) 58   Temp 98.9 F (37.2 C) (Oral)   Resp 16   Ht '5\' 8"'  (1.727 m)   Wt 69.3 kg (152 lb 12.5 oz)   SpO2 100%   BMI 23.23 kg/m   Physical Exam  Constitutional: He is oriented to person, place, and time. He appears well-developed and well-nourished. No distress.  HENT:  Head: Normocephalic and atraumatic.  Cardiovascular: Normal rate, regular rhythm and normal heart sounds.  No murmur heard. Pulmonary/Chest: Effort normal and breath sounds normal. No respiratory distress.  Abdominal: Soft. He exhibits no distension. There is no tenderness.  Musculoskeletal:  Diffuse swelling to the left elbow which is tender to palpation. Overlying erythema. Patient will not move the elbow. Tenderness diffusely to the wrist as well with no overlying skin changes or swelling. 2+ radial pulse. Sensation intact. Good cap refill.  Neurological: He is alert and oriented to person, place, and time.  Skin: Skin is warm and dry.  Nursing note and vitals reviewed.    ED Treatments / Results  Labs (all labs ordered are listed, but only abnormal results are displayed) Labs Reviewed  URINE CULTURE - Abnormal; Notable for the following components:      Result Value   Culture <10,000 COLONIES/mL INSIGNIFICANT GROWTH (*)    All other components within normal limits  CBC WITH DIFFERENTIAL/PLATELET - Abnormal; Notable for the following components:   WBC 10.7 (*)    RBC 3.07 (*)    Hemoglobin 9.9 (*)    HCT 29.6 (*)    Neutro Abs 9.2 (*)    All other components within normal limits  BASIC METABOLIC PANEL -  Abnormal; Notable for the following components:   Sodium 132 (*)    Potassium 3.4 (*)    Chloride 99 (*)    Glucose, Bld 130 (*)    All other components within normal limits  SEDIMENTATION RATE - Abnormal; Notable for the following components:   Sed Rate 35 (*)    All other components within normal limits  CBC WITH DIFFERENTIAL/PLATELET - Abnormal; Notable for the following components:   RBC 2.96 (*)    Hemoglobin 9.6 (*)    HCT 29.0 (*)    All other components within normal limits  BASIC METABOLIC PANEL -  Abnormal; Notable for the following components:   Glucose, Bld 103 (*)    Calcium 8.2 (*)    All other components within normal limits  URIC ACID - Abnormal; Notable for the following components:   Uric Acid, Serum 2.8 (*)    All other components within normal limits  GLUCOSE, CAPILLARY - Abnormal; Notable for the following components:   Glucose-Capillary 146 (*)    All other components within normal limits  GLUCOSE, CAPILLARY - Abnormal; Notable for the following components:   Glucose-Capillary 108 (*)    All other components within normal limits  GLUCOSE, CAPILLARY - Abnormal; Notable for the following components:   Glucose-Capillary 105 (*)    All other components within normal limits  CBC WITH DIFFERENTIAL/PLATELET - Abnormal; Notable for the following components:   RBC 2.96 (*)    Hemoglobin 9.5 (*)    HCT 28.8 (*)    All other components within normal limits  CULTURE, BLOOD (ROUTINE X 2)  CULTURE, BLOOD (ROUTINE X 2)  HIV ANTIBODY (ROUTINE TESTING)  URINALYSIS, ROUTINE W REFLEX MICROSCOPIC  GC/CHLAMYDIA PROBE AMP ([place]) NOT AT [facility] [address]    EKG  EKG Interpretation None       Radiology Dg Elbow Complete Left  Result Date: 08/20/2017 CLINICAL DATA:  Left elbow pain. EXAM: LEFT ELBOW - COMPLETE 3+ VIEW COMPARISON:  None. FINDINGS: Study limited by suboptimal positioning. Within this limitation, no gross fracture or dislocation is evident. Degenerative  changes are noted. There is soft tissue swelling around the elbow although no definite fat pad elevation can be identified on the limited lateral projection. IMPRESSION: Degenerative changes without evidence for an acute fracture or dislocation. Soft tissue swelling without definite fat pad elevation. Electronically Signed   By: Misty Stanley M.D.   On: 08/20/2017 16:18    Procedures Procedures (including critical care time)  Medications Ordered in ED Medications  vancomycin (VANCOCIN) IVPB 750 mg/150 ml premix (0 mg Intravenous Stopped 08/22/17 1016)  aspirin EC tablet 325 mg (325 mg Oral Given 08/22/17 0846)  fluticasone (FLONASE) 50 MCG/ACT nasal spray 1 spray (1 spray Each Nare Given 08/22/17 1053)  methocarbamol (ROBAXIN) tablet 500 mg (500 mg Oral Given 08/22/17 1054)  oxyCODONE-acetaminophen (PERCOCET/ROXICET) 5-325 MG per tablet 1-2 tablet (2 tablets Oral Given 08/22/17 1053)  pyridOXINE (VITAMIN B-6) tablet 50 mg (50 mg Oral Given 08/22/17 1054)  vitamin B-12 (CYANOCOBALAMIN) tablet 1,000 mcg (1,000 mcg Oral Given 08/22/17 1053)  enoxaparin (LOVENOX) injection 40 mg (40 mg Subcutaneous Given 08/21/17 2200)  sodium chloride flush (NS) 0.9 % injection 3 mL (3 mLs Intravenous Not Given 08/22/17 1118)  0.9 %  sodium chloride infusion ( Intravenous New Bag/Given 08/22/17 1356)  acetaminophen (TYLENOL) tablet 650 mg (650 mg Oral Given 08/21/17 2226)    Or  acetaminophen (TYLENOL) suppository 650 mg ( Rectal See Alternative 08/21/17 2226)  ketorolac (TORADOL) 30 MG/ML injection 30 mg (30 mg Intravenous Given 08/21/17 0335)  zolpidem (AMBIEN) tablet 5 mg (not administered)  senna (SENOKOT) tablet 8.6 mg (8.6 mg Oral Given 08/22/17 1053)  ondansetron (ZOFRAN) tablet 4 mg (not administered)    Or  ondansetron (ZOFRAN) injection 4 mg (not administered)  zinc sulfate capsule 220 mg (220 mg Oral Given 08/22/17 1053)  polyethylene glycol (MIRALAX / GLYCOLAX) packet 17 g (17 g Oral Given 08/22/17 1053)  colchicine tablet 0.6  mg (0.6 mg Oral Given 08/22/17 1053)  HYDROmorphone (DILAUDID) injection 0.5 mg (0.5 mg Intravenous Given 08/20/17 1320)  ondansetron (ZOFRAN) injection 4 mg (  4 mg Intravenous Given 08/20/17 1331)  LORazepam (ATIVAN) injection 1 mg (1 mg Intravenous Given 08/20/17 1552)  HYDROmorphone (DILAUDID) injection 0.5 mg (0.5 mg Intravenous Given 08/20/17 1551)  vancomycin (VANCOCIN) IVPB 1000 mg/200 mL premix (0 mg Intravenous Stopped 08/20/17 1956)     Initial Impression / Assessment and Plan / ED Course  I have reviewed the triage vital signs and the nursing notes.  Pertinent labs & imaging results that were available during my care of the patient were reviewed by me and considered in my medical decision making (see chart for details).  Clinical Course as of Aug 23 1611  Mon Aug 20, 2017  1806 Spoke with Dr. Sharol Given who will  consult on the patient.  [AH]    Clinical Course User Index [AH] Margarita Mail, PA-C    Danny Bender is a 61 y.o. male who presents to ED for acute onset of left elbow pain and swelling as well as left wrist pain. Underwent left total hip replacement on 12/21 by Dr. [name] of [facility]. Afebrile, hemodynamically stable. Left elbow is diffusely swollen and erythematous. He refuses to move the elbow whatsoever. ? Septic joint vs. Cellulitis vs. Bursitis vs. Gout vs. Clot. Will obtain labs, x-ray and ultrasound for further evaluation.   Labs reviewed and concerning for infectious etiology with elevated ESR at 35 and mild leukocytosis at 10.7. X-ray with soft tissue swelling otherwise negative.  Ultrasound pending at shift change. Care assumed by oncoming provider PA Harris. Case discussed, plan agreed upon. If ultrasound negative, will likely will need admission for IV ABX with tap in the morning if no clinical improvement.   Patient seen by and discussed with Dr. Dayna Barker who agrees with treatment plan.    Final Clinical Impressions(s) / ED Diagnoses   Final  diagnoses:  Cellulitis of left upper extremity    ED Discharge Orders    None       Senta Kantor, Ozella Almond, PA-C 08/20/17 1636    Mesner, Corene Cornea, MD 08/20/17 1649    Thomasa Heidler, Ozella Almond, PA-C 08/22/17 1613    Mesner, Corene Cornea, MD 08/22/17 2324

## 2017-08-20 NOTE — Telephone Encounter (Signed)
Received call from [place] at home advising that she received call this am from pt to cancel his appt with her and he stated that he was having some severe arm pain Right side with SOB. Marolyn Hammock urged him to go to ED to be evaluated to make sure there was no DVT. She wanted to let CB know and if he had questions to please call her but she going to follow up with pt later today to see how he is doing.   CB [phone]

## 2017-08-20 NOTE — ED Triage Notes (Signed)
Per [facility]: Pt to ED from home c/o L arm pain onset last night. Patient had L hip replacement 10 days ago and has been using his upper body more and thinks he may have done something to it last night. Swelling, warmth, and redness noted to L elbow, pt reports pain in elbow radiating to forearm. Radial pulses strong bilaterally, skin warm/dry, cap refill <3 sec. Pt reports chills started this morning, afebrile upon arrival. 18g. RFA, given 125mcg fentanyl PTA. No blood thinners, just baby ASA. EMS VS: HR 94 NSR, 97% RA, 153/76. Pt denies CP/SOB/dizziness.

## 2017-08-20 NOTE — ED Notes (Signed)
Patient transported to X-ray Pt followed by RN to assess pt response to medication

## 2017-08-20 NOTE — ED Notes (Addendum)
Admitting physician states to draw blood cultures prior to antibiotics.

## 2017-08-20 NOTE — Telephone Encounter (Signed)
FYI-see below- 

## 2017-08-20 NOTE — ED Provider Notes (Signed)
Patient here for Acute Left elbow and wrist pain. ? Cellulitis vs.gout vs. Septic joint  Follows with [facility]/ Will need admission for cellulitis.   5:11 PM DVT study is negative. Patient will be admitted for presumed cellulitis.    Margarita Mail, PA-C 08/20/17 2346    Fatima Blank, MD 08/21/17 [phone]

## 2017-08-20 NOTE — ED Notes (Signed)
Patient continues to yell and state that he needs to get up because his "wrist is on fire." RN elevated arm onto pillow and when asked where his pain is, patient stated, "it doesn't hurt at all." Patient then yelled "it hurts more in my wrist!" RN assisted patient getting more comfortable in bed as requested and then yelled at me to stop.

## 2017-08-21 DIAGNOSIS — M1A0220 Idiopathic chronic gout, left elbow, without tophus (tophi): Secondary | ICD-10-CM

## 2017-08-21 DIAGNOSIS — Z96642 Presence of left artificial hip joint: Secondary | ICD-10-CM

## 2017-08-21 DIAGNOSIS — L03114 Cellulitis of left upper limb: Secondary | ICD-10-CM | POA: Diagnosis not present

## 2017-08-21 LAB — CBC WITH DIFFERENTIAL/PLATELET
BASOS ABS: 0 10*3/uL (ref 0.0–0.1)
Basophils Relative: 0 %
EOS ABS: 0 10*3/uL (ref 0.0–0.7)
Eosinophils Relative: 0 %
HCT: 29 % — ABNORMAL LOW (ref 39.0–52.0)
HEMOGLOBIN: 9.6 g/dL — AB (ref 13.0–17.0)
Lymphocytes Relative: 10 %
Lymphs Abs: 0.8 10*3/uL (ref 0.7–4.0)
MCH: 32.4 pg (ref 26.0–34.0)
MCHC: 33.1 g/dL (ref 30.0–36.0)
MCV: 98 fL (ref 78.0–100.0)
Monocytes Absolute: 0.8 10*3/uL (ref 0.1–1.0)
Monocytes Relative: 9 %
NEUTROS PCT: 81 %
Neutro Abs: 6.9 10*3/uL (ref 1.7–7.7)
Platelets: 326 10*3/uL (ref 150–400)
RBC: 2.96 MIL/uL — AB (ref 4.22–5.81)
RDW: 13.2 % (ref 11.5–15.5)
WBC: 8.5 10*3/uL (ref 4.0–10.5)

## 2017-08-21 LAB — BASIC METABOLIC PANEL
Anion gap: 6 (ref 5–15)
BUN: 14 mg/dL (ref 6–20)
CHLORIDE: 104 mmol/L (ref 101–111)
CO2: 25 mmol/L (ref 22–32)
Calcium: 8.2 mg/dL — ABNORMAL LOW (ref 8.9–10.3)
Creatinine, Ser: 0.72 mg/dL (ref 0.61–1.24)
GFR calc Af Amer: >60 mL/min (ref >60)
GFR calc non Af Amer: >60 mL/min (ref >60)
Glucose, Bld: 103 mg/dL — ABNORMAL HIGH (ref 65–99)
POTASSIUM: 3.6 mmol/L (ref 3.5–5.1)
SODIUM: 135 mmol/L (ref 135–145)

## 2017-08-21 LAB — URINALYSIS, ROUTINE W REFLEX MICROSCOPIC
BILIRUBIN URINE: NEGATIVE
Glucose, UA: NEGATIVE mg/dL
Hgb urine dipstick: NEGATIVE
Ketones, ur: NEGATIVE mg/dL
Leukocytes, UA: NEGATIVE
NITRITE: NEGATIVE
Protein, ur: NEGATIVE mg/dL
SPECIFIC GRAVITY, URINE: 1.021 (ref 1.005–1.030)
pH: 5 (ref 5.0–8.0)

## 2017-08-21 LAB — URIC ACID: URIC ACID, SERUM: 2.8 mg/dL — AB (ref 4.4–7.6)

## 2017-08-21 LAB — GLUCOSE, CAPILLARY: GLUCOSE-CAPILLARY: 146 mg/dL — AB (ref 65–99)

## 2017-08-21 MED ORDER — COLCHICINE 0.6 MG PO TABS
0.6000 mg | ORAL_TABLET | Freq: Two times a day (BID) | ORAL | Status: DC
  Administered 2017-08-21 – 2017-08-23 (×6): 0.6 mg via ORAL
  Filled 2017-08-21 (×6): qty 1

## 2017-08-21 MED ORDER — POLYETHYLENE GLYCOL 3350 17 G PO PACK
17.0000 g | PACK | Freq: Two times a day (BID) | ORAL | Status: DC
  Administered 2017-08-21 – 2017-08-23 (×6): 17 g via ORAL
  Filled 2017-08-21 (×6): qty 1

## 2017-08-21 NOTE — Progress Notes (Signed)
PROGRESS NOTE  Kartik Fernando TOI:[id number] DOB: [birth year] DOA: 08/20/2017 PCP: [name], MD  HPI/Recap of past 24 hours: Danny Bender is a 62 y.o. 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 Pt reported pain starting overnight, L elbow with erythema and swelling. Pt denies any falls, insect bite or injury to elbow. Pt reported pain is 10/10, no radiation, exacerbated by touching. Pt also reported associated L wrist pain. Reported chills, denied any fever, abdominal pain, dysuria, chest pain, SOB, N/V/D/C. Pt also noted to have LLE extremity swelling, which has been present since post op as per pt. In the ED Xray elbow/wrist shows degenerative changes without evidence for an acute fracture or dislocation. Soft tissue swelling without definite fat pad elevation. Started on IV Vanc. Admitted for further management  Today, pt reported L elbow and wrist pain has improved, but still with limited ROM. Denies any other new complains, no chest pain, SOB, abdominal pain, N/V/D/C, fever/chills.   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  #L elbow/knee pain/swelling Likely Gout Vs cellulitis Vs septic arthritis Afebrile, resolved leukocytosis Elbow xray as above Duplex of elbow: neg for DVT BC X 2 pending Uric acid 2.8 Continue IV Vancomycin IVF Ortho on consult, likely gout, started on colchicine Continue colchicine  #Normocytic anemia Likely post op Daily CBC  #LLE swelling s/p L total hip replacement on 08/10/17 by Dr [name] consulted    Code Status: Full  Family Communication: None at bedside  Disposition Plan: Home once  stable   Consultants:  Ortho  Procedures:  None  Antimicrobials:  IV Vanc  DVT prophylaxis:  lovenox   Objective: Vitals:   08/20/17 1555 08/20/17 1821 08/20/17 1950 08/21/17 0541  BP: 131/62 (!) 125/57 139/64 117/80  Pulse: 69 66 66 (!) 55  Resp: 18 16 18 18   Temp:   99 F (37.2 C) 99.5 F (37.5 C)  TempSrc:   Oral Oral  SpO2: 95% 99% 97% 100%  Weight:   69.3 kg (152 lb 12.5 oz)   Height:   5\' 8"  (1.727 m)     Intake/Output Summary (Last 24 hours) at 08/21/2017 1436 Last data filed at 08/21/2017 0838 Gross per 24 hour  Intake 1168.33 ml  Output 850 ml  Net 318.33 ml   Filed Weights   08/20/17 1246 08/20/17 1950  Weight: 67.1 kg (148 lb) 69.3 kg (152 lb 12.5 oz)    Exam:   General:  Alert, awake, oriented, NAD  Cardiovascular: S1-S2 present, no added hrt sound  Respiratory: Chest clear bilaterally  Abdomen: Soft, non-tender, non-distended, BS present  Musculoskeletal: L elbow pain, erythema, limited ROM, L wrist pain, with limited ROM  Skin: Normal   Psychiatry: Normal mood   Data Reviewed: CBC: Recent Labs  Lab 08/20/17 1324 08/21/17 0600  WBC 10.7* 8.5  NEUTROABS 9.2* 6.9  HGB 9.9* 9.6*  HCT 29.6* 29.0*  MCV 96.4 98.0  PLT 374 833   Basic Metabolic Panel: Recent Labs  Lab 08/20/17 1324 08/21/17 0600  NA 132* 135  K 3.4* 3.6  CL 99* 104  CO2 24 25  GLUCOSE 130* 103*  BUN 19 14  CREATININE 0.76 0.72  CALCIUM 9.1 8.2*  GFR: Estimated Creatinine Clearance: 91.4 mL/min (by C-G formula based on SCr of 0.72 mg/dL). Liver Function Tests: No results for input(s): AST, ALT, ALKPHOS, BILITOT, PROT, ALBUMIN in the last 168 hours. No results for input(s): LIPASE, AMYLASE in the last 168 hours. No results for input(s): AMMONIA in the last 168 hours. Coagulation Profile: No results for input(s): INR, PROTIME in the last 168 hours. Cardiac Enzymes: No results for input(s): CKTOTAL, CKMB, CKMBINDEX, TROPONINI in the last 168  hours. BNP (last 3 results) No results for input(s): PROBNP in the last 8760 hours. HbA1C: No results for input(s): HGBA1C in the last 72 hours. CBG: Recent Labs  Lab 08/21/17 0827  GLUCAP 146*   Lipid Profile: No results for input(s): CHOL, HDL, LDLCALC, TRIG, CHOLHDL, LDLDIRECT in the last 72 hours. Thyroid Function Tests: No results for input(s): TSH, T4TOTAL, FREET4, T3FREE, THYROIDAB in the last 72 hours. Anemia Panel: No results for input(s): VITAMINB12, FOLATE, FERRITIN, TIBC, IRON, RETICCTPCT in the last 72 hours. Urine analysis:    Component Value Date/Time   COLORURINE YELLOW 08/21/2017 0354   APPEARANCEUR CLEAR 08/21/2017 0354   LABSPEC 1.021 08/21/2017 0354   PHURINE 5.0 08/21/2017 0354   GLUCOSEU NEGATIVE 08/21/2017 0354   HGBUR NEGATIVE 08/21/2017 0354   BILIRUBINUR NEGATIVE 08/21/2017 0354   KETONESUR NEGATIVE 08/21/2017 0354   PROTEINUR NEGATIVE 08/21/2017 0354   NITRITE NEGATIVE 08/21/2017 0354   LEUKOCYTESUR NEGATIVE 08/21/2017 0354   Sepsis Labs: @LABRCNTIP (procalcitonin:4,lacticidven:4)  )No results found for this or any previous visit (from the past 240 hour(s)).    Studies: Dg Elbow Complete Left  Result Date: 08/20/2017 CLINICAL DATA:  Left elbow pain. EXAM: LEFT ELBOW - COMPLETE 3+ VIEW COMPARISON:  None. FINDINGS: Study limited by suboptimal positioning. Within this limitation, no gross fracture or dislocation is evident. Degenerative changes are noted. There is soft tissue swelling around the elbow although no definite fat pad elevation can be identified on the limited lateral projection. IMPRESSION: Degenerative changes without evidence for an acute fracture or dislocation. Soft tissue swelling without definite fat pad elevation. Electronically Signed   By: Misty Stanley M.D.   On: 08/20/2017 16:18    Scheduled Meds: . aspirin  325 mg Oral Q breakfast  . colchicine  0.6 mg Oral BID  . enoxaparin (LOVENOX) injection  40 mg Subcutaneous Q24H   . fluticasone  1 spray Each Nare Daily  . polyethylene glycol  17 g Oral BID  . pyridoxine  50 mg Oral Daily  . senna  1 tablet Oral BID  . sodium chloride flush  3 mL Intravenous Q12H  . vitamin B-12  1,000 mcg Oral Daily  . zinc sulfate  220 mg Oral Daily    Continuous Infusions: . sodium chloride 100 mL/hr at 08/21/17 0338  . vancomycin Stopped (08/21/17 0851)     LOS: 0 days     Alma Friendly, MD [facility]   If 7PM-7AM, please contact night-coverage [email or web address] Password [facility] 08/21/2017, 2:36 PM

## 2017-08-21 NOTE — Consult Note (Signed)
ORTHOPAEDIC CONSULTATION  REQUESTING PHYSICIAN: Alma Friendly, MD  Chief Complaint: Swelling and pain left elbow and left wrist.  HPI: Danny Bender is a 63 y.o. male who presents with pain swelling left elbow and left wrist.  Patient is immediately postoperative from a total hip arthroplasty.  Patient states he was using his elbows to move himself around in his reclining chair he states that midnight the other night he had an acute onset of pain and at 2 AM the pain was unbearable.  Patient describes a similar episode of elbow pain and swelling that occurred several years ago that was not as severe.  Past Medical History:  Diagnosis Date  . Cancer ([place])    basal cell cancer  . Chronic back pain   . Complication of anesthesia    took days to clear head  . DDD (degenerative disc disease), cervical   . DDD (degenerative disc disease), lumbar   . Discoloration of skin    fingers and toe   . Headache   . History of shingles   . Nasal polyps   . OA (osteoarthritis) of hip    Left, l wrist  . Ruptured disk    lumbar   Past Surgical History:  Procedure Laterality Date  . HERNIA REPAIR  2004  . neck fusion    . TOTAL HIP ARTHROPLASTY Left 08/10/2017   Procedure: LEFT TOTAL HIP ARTHROPLASTY ANTERIOR APPROACH;  Surgeon: Mcarthur Rossetti, MD;  Location: [facility];  Service: Orthopedics;  Laterality: Left;   Social History   Socioeconomic History  . Marital status: Single    Spouse name: None  . Number of children: None  . Years of education: None  . Highest education level: None  Social Needs  . Financial resource strain: None  . Food insecurity - worry: None  . Food insecurity - inability: None  . Transportation needs - medical: None  . Transportation needs - non-medical: None  Occupational History  . None  Tobacco Use  . Smoking status: Never Smoker  . Smokeless tobacco: Never Used  Substance and Sexual Activity  . Alcohol use: Yes    Comment: 1  glass nightly occ  . Drug use: Yes    Types: Marijuana    Comment: occ.  last time 07/30/17  . Sexual activity: None  Other Topics Concern  . None  Social History Narrative  . None   No family history on file. - negative except otherwise stated in the family history section No Known Allergies Prior to Admission medications   Medication Sig Start Date End Date Taking? Authorizing Provider  aspirin EC 325 MG EC tablet Take 1 tablet (325 mg total) by mouth daily with breakfast. 08/11/17  Yes Mcarthur Rossetti, MD  fluticasone [facility]) 50 MCG/ACT nasal spray Place 1 spray into both nostrils daily.   Yes [provider]  GLUCOSAMINE SULFATE PO Take 1 tablet by mouth daily.   Yes [provider]  methocarbamol (ROBAXIN) 500 MG tablet Take 1 tablet (500 mg total) by mouth every 6 (six) hours as needed for muscle spasms. 08/11/17  Yes Mcarthur Rossetti, MD  milk thistle 175 MG tablet Take 175 mg by mouth daily.   Yes [provider]  oxyCODONE-acetaminophen (ROXICET) 5-325 MG tablet Take 1-2 tablets by mouth every 4 (four) hours as needed. 08/11/17  Yes Mcarthur Rossetti, MD  pyridoxine (B-6) 100 MG tablet Take 50 mg by mouth daily.   Yes [provider]  vitamin B-12 (CYANOCOBALAMIN) 1000 MCG tablet Take 1,000 mcg by mouth daily.   Yes [provider]  zinc gluconate 50 MG tablet Take 25 mg by mouth daily.   Yes [provider]   Dg Elbow Complete Left  Result Date: 08/20/2017 CLINICAL DATA:  Left elbow pain. EXAM: LEFT ELBOW - COMPLETE 3+ VIEW COMPARISON:  None. FINDINGS: Study limited by suboptimal positioning. Within this limitation, no gross fracture or dislocation is evident. Degenerative changes are noted. There is soft tissue swelling around the elbow although no definite fat pad elevation can be identified on the limited lateral projection. IMPRESSION: Degenerative changes without evidence for an acute fracture or  dislocation. Soft tissue swelling without definite fat pad elevation. Electronically Signed   By: Misty Stanley M.D.   On: 08/20/2017 16:18   Dg Wrist Complete Left  Result Date: 08/20/2017 CLINICAL DATA:  Wrist pain with no known injury. The patient awakened this morning with wrist pain. History of previous wrist fracture. EXAM: LEFT WRIST - COMPLETE 3+ VIEW COMPARISON:  None in PACs FINDINGS: The bones are subjectively adequately mineralized. There is a well-circumscribed ovoid subcentimeter lucency in the distal ulna. There is a similar-appearing but larger well-circumscribed lucency in the base of the second metacarpal. The joint spaces are well maintained. There is no acute or healing fracture. There is some irregularity of the radiocarpal articulation. The soft tissues exhibit no acute abnormality. Specific attention to the scaphoid reveals no abnormality. IMPRESSION: No significant arthropathy nor evidence of acute fracture of the wrist. There is mild spurring of the radioulnar articulation consistent with mild osteoarthritis. Well-circumscribed benign-appearing cystic changes in the base of the second metacarpal and in the distal ulna. Electronically Signed   By: David  Martinique M.D.   On: 08/20/2017 14:10   - pertinent xrays, CT, MRI studies were reviewed and independently interpreted  Positive ROS: All other systems have been reviewed and were otherwise negative with the exception of those mentioned in the HPI and as above.  Physical Exam: General: Alert, no acute distress Psychiatric: Patient is competent for consent with normal mood and affect Lymphatic: No axillary or cervical lymphadenopathy Cardiovascular: No pedal edema Respiratory: No cyanosis, no use of accessory musculature GI: No organomegaly, abdomen is soft and non-tender  Skin: Examination there is redness and warmth around the left elbow and left wrist.   Neurologic: Patient does have protective sensation bilateral lower  extremities.   MUSCULOSKELETAL:  Examination there is warmth and redness around the left elbow and left wrist.  There is no olecranon bursal swelling.  There is no cellulitis no signs of infection.  Range of motion of the wrist and elbow are painful consistent with intra-articular inflammation.  Patient's uric acid is low at 2.8 but his clinical symptoms and  findings and previous history of a similar episode are consistent with gout.  Assessment: Assessment: Gout left elbow and left wrist status post stress of total hip arthroplasty with no clinical signs or symptoms of bursitis, infection or DVT.  Plan: Plan: Orders were written for colchicine 0.6 mg twice daily.  If patient has immediate relief he could be discharged on colchicine 1 tablet daily and allopurinol 100 mg twice a day.  I will follow-up in the office.  Thank you for the consult and the opportunity to see Danny Bender, Danny Bender [phone] 10:17 AM

## 2017-08-22 ENCOUNTER — Other Ambulatory Visit: Payer: Self-pay

## 2017-08-22 ENCOUNTER — Encounter (HOSPITAL_COMMUNITY): Payer: Self-pay | Admitting: General Practice

## 2017-08-22 DIAGNOSIS — L03114 Cellulitis of left upper limb: Secondary | ICD-10-CM | POA: Diagnosis not present

## 2017-08-22 DIAGNOSIS — M1A0220 Idiopathic chronic gout, left elbow, without tophus (tophi): Secondary | ICD-10-CM | POA: Diagnosis not present

## 2017-08-22 DIAGNOSIS — Z96642 Presence of left artificial hip joint: Secondary | ICD-10-CM | POA: Diagnosis not present

## 2017-08-22 LAB — CBC WITH DIFFERENTIAL/PLATELET
BASOS PCT: 0 %
Basophils Absolute: 0 10*3/uL (ref 0.0–0.1)
EOS ABS: 0.1 10*3/uL (ref 0.0–0.7)
Eosinophils Relative: 1 %
HCT: 28.8 % — ABNORMAL LOW (ref 39.0–52.0)
HEMOGLOBIN: 9.5 g/dL — AB (ref 13.0–17.0)
LYMPHS ABS: 1 10*3/uL (ref 0.7–4.0)
Lymphocytes Relative: 11 %
MCH: 32.1 pg (ref 26.0–34.0)
MCHC: 33 g/dL (ref 30.0–36.0)
MCV: 97.3 fL (ref 78.0–100.0)
Monocytes Absolute: 0.9 10*3/uL (ref 0.1–1.0)
Monocytes Relative: 10 %
NEUTROS PCT: 78 %
Neutro Abs: 7 10*3/uL (ref 1.7–7.7)
Platelets: 351 10*3/uL (ref 150–400)
RBC: 2.96 MIL/uL — AB (ref 4.22–5.81)
RDW: 12.9 % (ref 11.5–15.5)
WBC: 8.9 10*3/uL (ref 4.0–10.5)

## 2017-08-22 LAB — URINE CULTURE

## 2017-08-22 LAB — GC/CHLAMYDIA PROBE AMP (~~LOC~~) NOT AT ARMC
CHLAMYDIA, DNA PROBE: NEGATIVE
Neisseria Gonorrhea: NEGATIVE

## 2017-08-22 LAB — GLUCOSE, CAPILLARY
Glucose-Capillary: 108 mg/dL — ABNORMAL HIGH (ref 65–99)
Glucose-Capillary: 105 mg/dL — ABNORMAL HIGH (ref 65–99)

## 2017-08-22 LAB — HIV ANTIBODY (ROUTINE TESTING W REFLEX): HIV Screen 4th Generation wRfx: NONREACTIVE

## 2017-08-22 NOTE — Progress Notes (Deleted)
Called to room by family member, pt noted to be not breathing, no pulse. Called another nurse, Gypsy Lore., to verify, pronounced time of death 1:00am, family members at bedside. Dr. Rogelia Mire, P. Informed and [facility] called. Pt not suitable for organ donation. Awaiting other family members to come.

## 2017-08-22 NOTE — Progress Notes (Signed)
PROGRESS NOTE  Danny Bender YIR:[id number] DOB: February 28, 1956 DOA: 08/20/2017 PCP: [name], MD  HPI/Recap of past 24 hours: Danny Bender is a 62 y.o. 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 Pt reported pain starting overnight, L elbow with erythema and swelling. Pt denies any falls, insect bite or injury to elbow. Pt reported pain is 10/10, no radiation, exacerbated by touching. Pt also reported associated L wrist pain. Reported chills, denied any fever, abdominal pain, dysuria, chest pain, SOB, N/V/D/C. Pt also noted to have LLE extremity swelling, which has been present since post op as per pt. In the ED Xray elbow/wrist shows degenerative changes without evidence for an acute fracture or dislocation. Soft tissue swelling without definite fat pad elevation. Started on IV Vanc. Admitted for further management  Today, pt still reported L elbow and wrist pain, also with pain in R elbow all with limited ROM. Noted to spike a temp of 102.3. Denies any other new complains, no chest pain, SOB, abdominal pain, N/V/D/C.   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  #L elbow/knee pain/swelling Likely cellulitis Vs septic arthritis Vs Gout Now with R elbow tenderness Febrile, 102 on 08/21/17, with resolved leukocytosis Elbow xray as above Duplex of elbow: neg for DVT BC X 2 pending, repeat if another temp spike Uric acid 2.8 Urine GC chlamydia pending collection IVF Continue IV Vancomycin, consider broad spectrum/ID consult if persistent fever Ortho on consulted, rec likely gout, started on colchicine Continue colchicine  #Normocytic anemia Stable Likely post op Daily  CBC  #LLE swelling s/p L total hip replacement on 08/10/17 by Dr [name] consulted    Code Status: Full  Family Communication: None at bedside  Disposition Plan: Home once stable   Consultants:  Ortho  Procedures:  None  Antimicrobials:  IV Vanc  DVT prophylaxis:  lovenox   Objective: Vitals:   08/21/17 1527 08/21/17 2141 08/22/17 0100 08/22/17 0504  BP: (!) 153/102 (!) 126/49  (!) 127/48  Pulse: 94 64  (!) 55  Resp: 20 18  18   Temp: 100.3 F (37.9 C) (!) 102.3 F (39.1 C) (!) 100.9 F (38.3 C) 99.5 F (37.5 C)  TempSrc: Oral Oral Oral Oral  SpO2: 100% 97%  100%  Weight:      Height:        Intake/Output Summary (Last 24 hours) at 08/22/2017 1435 Last data filed at 08/22/2017 0500 Gross per 24 hour  Intake 2631 ml  Output 870 ml  Net 1761 ml   Filed Weights   08/20/17 1246 08/20/17 1950  Weight: 67.1 kg (148 lb) 69.3 kg (152 lb 12.5 oz)    Exam:   General:  Alert, awake, oriented, NAD  Cardiovascular: S1-S2 present, no added hrt sound  Respiratory: Chest clear bilaterally  Abdomen: Soft, non-tender, non-distended, BS present  Musculoskeletal: L & R elbow pain, erythema, limited ROM, L wrist pain, with limited ROM  Skin: Normal   Psychiatry: Normal mood   Data Reviewed: CBC: Recent Labs  Lab 08/20/17 1324 08/21/17 0600 08/22/17 0842  WBC 10.7* 8.5 8.9  NEUTROABS 9.2* 6.9 7.0  HGB 9.9* 9.6* 9.5*  HCT 29.6* 29.0* 28.8*  MCV 96.4 98.0 97.3  PLT 374 326 409   Basic Metabolic Panel: Recent Labs  Lab 08/20/17 1324 08/21/17 0600  NA 132* 135  K 3.4* 3.6  CL 99* 104  CO2 24 25  GLUCOSE 130* 103*  BUN 19 14  CREATININE 0.76 0.72  CALCIUM 9.1 8.2*   GFR: Estimated Creatinine Clearance: 91.4 mL/min (by C-G formula based on SCr of 0.72 mg/dL). Liver Function Tests: No results for input(s): AST, ALT, ALKPHOS, BILITOT, PROT, ALBUMIN in the last 168 hours. No results for input(s): LIPASE, AMYLASE in the last 168  hours. No results for input(s): AMMONIA in the last 168 hours. Coagulation Profile: No results for input(s): INR, PROTIME in the last 168 hours. Cardiac Enzymes: No results for input(s): CKTOTAL, CKMB, CKMBINDEX, TROPONINI in the last 168 hours. BNP (last 3 results) No results for input(s): PROBNP in the last 8760 hours. HbA1C: No results for input(s): HGBA1C in the last 72 hours. CBG: Recent Labs  Lab 08/21/17 0827 08/22/17 0501 08/22/17 0804  GLUCAP 146* 108* 105*   Lipid Profile: No results for input(s): CHOL, HDL, LDLCALC, TRIG, CHOLHDL, LDLDIRECT in the last 72 hours. Thyroid Function Tests: No results for input(s): TSH, T4TOTAL, FREET4, T3FREE, THYROIDAB in the last 72 hours. Anemia Panel: No results for input(s): VITAMINB12, FOLATE, FERRITIN, TIBC, IRON, RETICCTPCT in the last 72 hours. Urine analysis:    Component Value Date/Time   COLORURINE YELLOW 08/21/2017 0354   APPEARANCEUR CLEAR 08/21/2017 0354   LABSPEC 1.021 08/21/2017 0354   PHURINE 5.0 08/21/2017 0354   GLUCOSEU NEGATIVE 08/21/2017 0354   HGBUR NEGATIVE 08/21/2017 0354   BILIRUBINUR NEGATIVE 08/21/2017 0354   KETONESUR NEGATIVE 08/21/2017 0354   PROTEINUR NEGATIVE 08/21/2017 0354   NITRITE NEGATIVE 08/21/2017 0354   LEUKOCYTESUR NEGATIVE 08/21/2017 0354   Sepsis Labs: @LABRCNTIP (procalcitonin:4,lacticidven:4)  ) Recent Results (from the past 240 hour(s))  Urine culture     Status: Abnormal   Collection Time: 08/20/17  4:00 AM  Result Value Ref Range Status   Specimen Description URINE, CLEAN CATCH  Final   Special Requests NONE  Final   Culture <10,000 COLONIES/mL INSIGNIFICANT GROWTH (A)  Final   Report Status 08/22/2017 FINAL  Final  Blood culture (routine x 2)     Status: None (Preliminary result)   Collection Time: 08/20/17  6:37 PM  Result Value Ref Range Status   Specimen Description BLOOD LEFT FOREARM  Final   Special Requests   Final    BOTTLES DRAWN AEROBIC AND ANAEROBIC Blood  Culture adequate volume   Culture NO GROWTH < 24 HOURS  Final   Report Status PENDING  Incomplete  Blood culture (routine x 2)     Status: None (Preliminary result)   Collection Time: 08/20/17  6:39 PM  Result Value Ref Range Status   Specimen Description BLOOD RIGHT HAND  Final   Special Requests   Final    BOTTLES DRAWN AEROBIC AND ANAEROBIC Blood Culture adequate volume   Culture NO GROWTH < 24 HOURS  Final   Report Status PENDING  Incomplete      Studies: No results found.  Scheduled Meds: . aspirin  325 mg Oral Q breakfast  . colchicine  0.6 mg Oral BID  . enoxaparin (LOVENOX) injection  40 mg Subcutaneous Q24H  . fluticasone  1 spray Each Nare Daily  . polyethylene glycol  17 g Oral BID  . pyridoxine  50 mg Oral Daily  . senna  1 tablet Oral BID  . sodium chloride flush  3 mL  Intravenous Q12H  . vitamin B-12  1,000 mcg Oral Daily  . zinc sulfate  220 mg Oral Daily    Continuous Infusions: . sodium chloride 100 mL/hr at 08/22/17 1356  . vancomycin Stopped (08/22/17 1016)     LOS: 0 days     Alma Friendly, MD [facility]   If 7PM-7AM, please contact night-coverage [email or web address] Password [email or web address] 08/22/2017, 2:35 PM

## 2017-08-23 ENCOUNTER — Inpatient Hospital Stay (INDEPENDENT_AMBULATORY_CARE_PROVIDER_SITE_OTHER): Payer: BLUE CROSS/BLUE SHIELD | Admitting: Orthopaedic Surgery

## 2017-08-23 DIAGNOSIS — L03114 Cellulitis of left upper limb: Secondary | ICD-10-CM | POA: Diagnosis not present

## 2017-08-23 LAB — GLUCOSE, CAPILLARY: Glucose-Capillary: 89 mg/dL (ref 65–99)

## 2017-08-23 MED ORDER — MUSCLE RUB 10-15 % EX CREA
TOPICAL_CREAM | CUTANEOUS | Status: DC | PRN
  Filled 2017-08-23: qty 85

## 2017-08-23 NOTE — Progress Notes (Signed)
Upon entering room, patient became irritable and expressed that he was upset because he was not going to get any rest while here in the hospital. Patient also expressed his anger at having to remain in the hospital and not be able to spend time with his brother who is visiting from out of town and will be leaving the next day. Nurse offered to bring the patient his evening medications at that time and read to the patient each medication he had scheduled and what they were for. Patient acknowledged. Patient then wanted assistance getting out of bed to chair. Nurse asked the patient if he would like to use the walker; patient refused. Nurse then asked how the patient would like the nurse to assist him out of bed. Patient then turned off the lights and would not answer nurse's questions. Nurse asked if the patient still wanted his evening medications at that time. Patient stated to "just go ahead" and bring all his medications to "overdose" him. Patient expressed that all he wanted to do was get some sleep but hospital staff was not allowing him to do so. As nurse was leaving the room, patient stated "do not bring me any medications until you consult with somebody else."

## 2017-08-23 NOTE — Progress Notes (Signed)
PROGRESS NOTE  Danny Bender IWP:[id number] DOB: 1956-06-24 DOA: 08/20/2017 PCP: Danny Huddle, MD  HPI/Recap of past 24 hours:   62 y.o. 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 Pt reported pain starting overnight, L elbow with erythema and swelling. Pt denies any falls, insect bite or injury to elbow. Pt reported pain is 10/10, no radiation, exacerbated by touching. Pt also reported associated L wrist pain. Reported chills, denied any fever, abdominal pain, dysuria, chest pain, SOB, N/V/D/C. Pt also noted to have LLE extremity swelling, which has been present since post op as per pt. In the ED Xray elbow/wrist shows degenerative changes without evidence for an acute fracture or dislocation. Soft tissue swelling without definite fat pad elevation. Started on IV Vanc. Admitted for further management  Today, pt still reported L elbow and wrist pain, also with pain in R elbow all with limited ROM. Noted to spike a temp of 102.3. Denies any other new complains, no chest pain, SOB, abdominal pain, N/V/D/C.   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  #L elbow/knee pain/swelling Likely Gout Now with R elbow tenderness Febrile, 102 on 08/21/17, with resolved leukocytosis Elbow xray as above Duplex of elbow: neg for DVT BC X 2 pending, repeat if another temp spike Uric acid 2.8 Urine GC chlamydia pending collection IVF Stop today IV Vancomycin  Continue colchicine as improved on the same--if continues to get better d/c soon  #Normocytic anemia Stable Likely post op Daily CBC  #LLE swelling s/p L total hip replacement on 08/10/17 by Dr [name] consulted    Code  Status: Full  Family Communication: None at bedside  Disposition Plan: Home once stable   Consultants:  Ortho  Procedures:  None  Antimicrobials:  IV Vanc  DVT prophylaxis:  lovenox   Objective: Vitals:   08/22/17 0504 08/22/17 1506 08/22/17 1955 08/23/17 0617  BP: (!) 127/48 (!) 126/50 (!) 117/53 116/66  Pulse: (!) 55 (!) 58 (!) 56 (!) 48  Resp: 18 16 16 16   Temp: 99.5 F (37.5 C) 98.9 F (37.2 C) 98.7 F (37.1 C) 98.5 F (36.9 C)  TempSrc: Oral Oral Oral Oral  SpO2: 100% 100% 98% 97%  Weight:      Height:        Intake/Output Summary (Last 24 hours) at 08/23/2017 1532 Last data filed at 08/23/2017 1124 Gross per 24 hour  Intake 3151.67 ml  Output 400 ml  Net 2751.67 ml   Filed Weights   08/20/17 1246 08/20/17 1950  Weight: 67.1 kg (148 lb) 69.3 kg (152 lb 12.5 oz)    Exam:  Awake alert pleasant s1 s2 no m/r/g cta b abd soft nt nd no distension No le edema   Data Reviewed: CBC: Recent Labs  Lab 08/20/17 1324 08/21/17 0600 08/22/17 0842  WBC 10.7* 8.5 8.9  NEUTROABS 9.2* 6.9 7.0  HGB 9.9* 9.6* 9.5*  HCT 29.6* 29.0* 28.8*  MCV 96.4 98.0 97.3  PLT 374 326 505   Basic Metabolic Panel: Recent Labs  Lab 08/20/17 1324 08/21/17 0600  NA 132* 135  K 3.4* 3.6  CL 99* 104  CO2 24 25  GLUCOSE 130* 103*  BUN 19 14  CREATININE 0.76 0.72  CALCIUM 9.1 8.2*   GFR: Estimated Creatinine Clearance: 91.4 mL/min (by C-G formula based on SCr of 0.72 mg/dL). Liver Function Tests: No results for input(s): AST, ALT, ALKPHOS, BILITOT, PROT, ALBUMIN in the last 168 hours. No results for input(s): LIPASE, AMYLASE in the last 168 hours. No results for input(s): AMMONIA in the last 168 hours. Coagulation Profile: No results for input(s): INR, PROTIME in the last 168 hours. Cardiac Enzymes: No results for input(s): CKTOTAL, CKMB, CKMBINDEX, TROPONINI in the last 168 hours. BNP (last 3 results) No results for input(s): PROBNP in the last 8760  hours. HbA1C: No results for input(s): HGBA1C in the last 72 hours. CBG: Recent Labs  Lab 08/21/17 0827 08/22/17 0501 08/22/17 0804 08/23/17 0739  GLUCAP 146* 108* 105* 89   Lipid Profile: No results for input(s): CHOL, HDL, LDLCALC, TRIG, CHOLHDL, LDLDIRECT in the last 72 hours. Thyroid Function Tests: No results for input(s): TSH, T4TOTAL, FREET4, T3FREE, THYROIDAB in the last 72 hours. Anemia Panel: No results for input(s): VITAMINB12, FOLATE, FERRITIN, TIBC, IRON, RETICCTPCT in the last 72 hours. Urine analysis:    Component Value Date/Time   COLORURINE YELLOW 08/21/2017 0354   APPEARANCEUR CLEAR 08/21/2017 0354   LABSPEC 1.021 08/21/2017 0354   PHURINE 5.0 08/21/2017 0354   GLUCOSEU NEGATIVE 08/21/2017 0354   HGBUR NEGATIVE 08/21/2017 0354   BILIRUBINUR NEGATIVE 08/21/2017 0354   KETONESUR NEGATIVE 08/21/2017 0354   PROTEINUR NEGATIVE 08/21/2017 0354   NITRITE NEGATIVE 08/21/2017 0354   LEUKOCYTESUR NEGATIVE 08/21/2017 0354   Sepsis Labs: @LABRCNTIP (procalcitonin:4,lacticidven:4)  ) Recent Results (from the past 240 hour(s))  Urine culture     Status: Abnormal   Collection Time: 08/20/17  4:00 AM  Result Value Ref Range Status   Specimen Description URINE, CLEAN CATCH  Final   Special Requests NONE  Final   Culture <10,000 COLONIES/mL INSIGNIFICANT GROWTH (A)  Final   Report Status 08/22/2017 FINAL  Final  Blood culture (routine x 2)     Status: None (Preliminary result)   Collection Time: 08/20/17  6:37 PM  Result Value Ref Range Status   Specimen Description BLOOD LEFT FOREARM  Final   Special Requests   Final    BOTTLES DRAWN AEROBIC AND ANAEROBIC Blood Culture adequate volume   Culture NO GROWTH 3 DAYS  Final   Report Status PENDING  Incomplete  Blood culture (routine x 2)     Status: None (Preliminary result)   Collection Time: 08/20/17  6:39 PM  Result Value Ref Range Status   Specimen Description BLOOD RIGHT HAND  Final   Special Requests   Final     BOTTLES DRAWN AEROBIC AND ANAEROBIC Blood Culture adequate volume   Culture NO GROWTH 3 DAYS  Final   Report Status PENDING  Incomplete      Studies: No results found.  Scheduled Meds: . aspirin  325 mg Oral Q breakfast  . colchicine  0.6 mg Oral BID  . enoxaparin (LOVENOX) injection  40 mg Subcutaneous Q24H  . fluticasone  1 spray Each Nare Daily  . polyethylene glycol  17 g Oral BID  . pyridoxine  50 mg Oral Daily  . senna  1 tablet Oral BID  . sodium chloride flush  3 mL Intravenous Q12H  . vitamin B-12  1,000 mcg Oral Daily  . zinc sulfate  220 mg Oral Daily    Continuous Infusions: . sodium chloride 100 mL/hr at 08/23/17 1030     LOS: 0 days  Nita Sells, MD [facility]   If 7PM-7AM, please contact night-coverage [email or web address] Password [facility] 08/23/2017, 3:32 PM

## 2017-08-23 NOTE — Progress Notes (Signed)
Patient called nurse into room; patient apologetic regarding previous meeting with nurse when the patient was upset. Patient expressed he was frustrated because he has been in the hospital and unable to spend time with his brother, and that he didn't understand why he was still here. Patient stated he didn't have a choice in coming to the hospital because his brother called EMS and had him brought to the hospital. He stated he just wanted to go home. Patient now calm and cooperative.

## 2017-08-23 NOTE — Evaluation (Signed)
Physical Therapy Evaluation Patient Details Name: Danny Bender MRN: [id number] DOB: 29-Jun-1956 Today's Date: 08/23/2017   History of Present Illness  Pt is a 62 y/o male admitted secondary to LUE swelling at the elbow and hand. Once admitted, R elbow began to swell as well. Per notes, suspected gout. Pt with recent L THA, direct anterior approach on 12/21. PMH includes chronic back pain, skin cancer, and L THA.   Clinical Impression  Pt admitted secondary to problem above with deficits below. PTA, pt with recent L THA and required RW for ambulation. Upon eval, pt presenting with bilat elbow pain resulting in decreased ROM, and post op pain and weakness from previous L THA. Required supervision for mobility with RW and required safety education to use RW during ambulation. Had LOB X 2 requiring min A for steadying during gait. Otherwise steady. Reports his brother is going back home which is out of town, so will not have anyone to check on him. Also, does not want neighbors checking in on him. Recommending HHPT to ensure safety with mobility at home. Will continue to follow acutely to maximize functional mobility independence and safety.     Follow Up Recommendations Home health PT    Equipment Recommendations  None recommended by PT    Recommendations for Other Services OT consult     Precautions / Restrictions Precautions Precautions: None Restrictions Weight Bearing Restrictions: Yes LLE Weight Bearing: Weight bearing as tolerated      Mobility  Bed Mobility               General bed mobility comments: Standing in room upon entry.   Transfers Overall transfer level: Needs assistance Equipment used: Rolling walker (2 wheeled) Transfers: Sit to/from Stand Sit to Stand: Supervision         General transfer comment: Supervision for safety. Very slow transition. Unable to use UEs to assist with transfer secondary to pain, however, able to control transition well.    Ambulation/Gait Ambulation/Gait assistance: Supervision;Min assist Ambulation Distance (Feet): 500 Feet Assistive device: Rolling walker (2 wheeled) Gait Pattern/deviations: Step-through pattern;Decreased stride length;Antalgic Gait velocity: Decreased  Gait velocity interpretation: Below normal speed for age/gender General Gait Details: Slow, antalgic gait. Pt initially not using RW in room and required education about importance of using RW. Unable to fully grip handles secondary to pain in hands. LOB X 2, requiring min A for steadying, otherwise supervision for safety. Pt letting go of RW throughout, and required safety cues for safe use of RW.   Stairs            Wheelchair Mobility    Modified Rankin (Stroke Patients Only)       Balance Overall balance assessment: Needs assistance Sitting-balance support: No upper extremity supported;Feet supported Sitting balance-Leahy Scale: Good     Standing balance support: Bilateral upper extremity supported;No upper extremity supported;During functional activity Standing balance-Leahy Scale: Fair Standing balance comment: Able to maintain static standing without UE support.                              Pertinent Vitals/Pain Pain Assessment: Faces Faces Pain Scale: Hurts even more Pain Location: bilat elbows and L hand  Pain Descriptors / Indicators: Grimacing;Guarding Pain Intervention(s): Limited activity within patient's tolerance;Monitored during session;Repositioned    Home Living Family/patient expects to be discharged to:: Private residence Living Arrangements: Alone Available Help at Discharge: (reports brother is going back and will have no  one to help) Type of Home: House Home Access: Stairs to enter Entrance Stairs-Rails: Left Entrance Stairs-Number of Steps: 3-4 Home Layout: Two level;1/2 bath on main level Home Equipment: Cane - single point;Walker - 2 wheels;Bedside commode Additional  Comments: Reports brother is going back home and will not be able to check on him. When asked about family or neighbors or friends, pt reports he doesn't want his neighbors to come by and does not have any friends to check on him.     Prior Function Level of Independence: Independent with assistive device(s)         Comments: Reports he has been using RW at home since THA.      Hand Dominance        Extremity/Trunk Assessment   Upper Extremity Assessment Upper Extremity Assessment: RUE deficits/detail;LUE deficits/detail RUE Deficits / Details: RUE swelling at the elbow noted. Limited ROM in elbow.  LUE Deficits / Details: LUE swelling and redness at elbow into hand. Limited ROM in hands and elbow.     Lower Extremity Assessment Lower Extremity Assessment: LLE deficits/detail LLE Deficits / Details: LLE guarding secondary to pain in hip from previous hip replacement.     Cervical / Trunk Assessment Cervical / Trunk Assessment: Other exceptions Cervical / Trunk Exceptions: chronic back pain  Communication   Communication: No difficulties  Cognition Arousal/Alertness: Awake/alert Behavior During Therapy: WFL for tasks assessed/performed Overall Cognitive Status: Within Functional Limits for tasks assessed                                        General Comments General comments (skin integrity, edema, etc.): Pt asking about ways to get home, as pt's brother is going home and lives out of town. Educated about ambulance transport home, however, pt not wanting to do that.     Exercises Other Exercises Other Exercises: Educated about importance of ROM activities in elbows and in hands. Pt performed a few reps during session.    Assessment/Plan    PT Assessment Patient needs continued PT services  PT Problem List Decreased strength;Decreased range of motion;Decreased balance;Decreased mobility;Decreased knowledge of use of DME;Decreased safety  awareness;Pain       PT Treatment Interventions DME instruction;Gait training;Stair training;Functional mobility training;Therapeutic exercise;Therapeutic activities;Balance training;Neuromuscular re-education;Patient/family education    PT Goals (Current goals can be found in the Care Plan section)  Acute Rehab PT Goals Patient Stated Goal: to get better  PT Goal Formulation: With patient Time For Goal Achievement: 09/06/17 Potential to Achieve Goals: Good    Frequency Min 3X/week   Barriers to discharge Decreased caregiver support Lives alone and reports no one available to check on him     Co-evaluation               AM-PAC PT "6 Clicks" Daily Activity  Outcome Measure Difficulty turning over in bed (including adjusting bedclothes, sheets and blankets)?: A Little Difficulty moving from lying on back to sitting on the side of the bed? : A Little Difficulty sitting down on and standing up from a chair with arms (e.g., wheelchair, bedside commode, etc,.)?: A Little Help needed moving to and from a bed to chair (including a wheelchair)?: A Little Help needed walking in hospital room?: A Little Help needed climbing 3-5 steps with a railing? : A Lot 6 Click Score: 17    End of Session   Activity Tolerance:  Patient tolerated treatment well Patient left: in chair;with call bell/phone within reach Nurse Communication: Mobility status PT Visit Diagnosis: Muscle weakness (generalized) (M62.81);Difficulty in walking, not elsewhere classified (R26.2);Pain Pain - Right/Left: (bilat ) Pain - part of body: Hand(elbow )    Time: 5615-3794 PT Time Calculation (min) (ACUTE ONLY): 53 min   Charges:   PT Evaluation $PT Eval Low Complexity: 1 Low PT Treatments $Gait Training: 23-37 mins $Self Care/Home Management: 8-22   PT G Codes:        Leighton Ruff, PT, DPT  Acute Rehabilitation Services  Pager: [phone]   Rudean Hitt 08/23/2017, 6:49 PM

## 2017-08-24 DIAGNOSIS — L03114 Cellulitis of left upper limb: Secondary | ICD-10-CM | POA: Diagnosis not present

## 2017-08-24 LAB — CREATININE, SERUM
Creatinine, Ser: 0.74 mg/dL (ref 0.61–1.24)
GFR calc Af Amer: >60 mL/min (ref >60)
GFR calc non Af Amer: >60 mL/min (ref >60)

## 2017-08-24 LAB — GLUCOSE, CAPILLARY: Glucose-Capillary: 82 mg/dL (ref 65–99)

## 2017-08-24 LAB — VANCOMYCIN, TROUGH: VANCOMYCIN TR: 4 ug/mL — AB (ref 15–20)

## 2017-08-24 MED ORDER — COLCHICINE 0.6 MG PO TABS: 0.6000 mg | ORAL_TABLET | Freq: Two times a day (BID) | ORAL | 0 refills | Status: DC

## 2017-08-24 MED ORDER — ACETAMINOPHEN 325 MG PO TABS: 650.0000 mg | ORAL_TABLET | Freq: Four times a day (QID) | ORAL | Status: DC | PRN

## 2017-08-24 NOTE — Discharge Summary (Signed)
Physician Discharge Summary  Danny Bender TFT:[id number] DOB: August 23, 1955 DOA: 08/20/2017  PCP: [name], MD  Admit date: 08/20/2017 Discharge date: 08/24/2017  Time spent: 25 minutes  Recommendations for Outpatient Follow-up:  1. 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 Pt reported pain starting overnight, L elbow with erythema and swelling. Pt denies any falls, insect bite or injury to elbow. Pt reported pain is 10/10, no radiation, exacerbated by touching. Pt also reported associated L wrist pain. Reported chills, denied any fever, abdominal pain, dysuria, chest pain, SOB, N/V/D/C. Pt also noted to have LLE extremity swelling, which has been present since post op as per pt. In the ED Xray elbow/wrist shows degenerative changes without evidence for an acute fracture or dislocation. Soft tissue swelling without definite fat pad elevation.Started on IV Vanc. Admitted for further management    Hospital Course:  Orthopedics saw patient in consult-recommneded trial colchicine-swelling signif improved-stopped all abx--ambulatory post Rx and did well- Xr blood cult and duplex all neg--d/c home   Consultations:  Dr. Mitzie Na  Discharge Exam: Vitals:   08/23/17 2203 08/24/17 0526  BP: (!) 157/89 133/68  Pulse: (!) 53 (!) 59   Resp: 18 16  Temp: 98.9 F (37.2 C) 98.4 F (36.9 C)  SpO2: 100% 100%    General: eomi ncat Cardiovascular:  s1 s 2no m/r/g Respiratory: clear  L elbow not swollen nor distended  Discharge Instructions   Discharge Instructions    Diet - low sodium heart healthy   Complete by:  As directed    Discharge instructions   Complete by:  As directed    Take the colchicine x 2 per day\ You will need follow up in about 1 week with your regular MD--Youmight need another rmedication called Allopurinol to prevent gout   Increase activity slowly   Complete by:  As directed      Allergies as of 08/24/2017   No Known Allergies     Medication List    TAKE these medications   acetaminophen 325 MG tablet Commonly known as:  TYLENOL Take 2 tablets (650 mg total) by mouth every 6 (six) hours as needed for mild pain (or Fever >/= 101).   aspirin 325 MG EC tablet Take 1 tablet (325 mg total) by mouth daily with breakfast.   colchicine 0.6 MG tablet Take 1 tablet (0.6 mg total) by mouth 2 (two) times daily.   fluticasone 50 MCG/ACT nasal spray Commonly known as:  FLONASE Place 1 spray into both nostrils daily.   GLUCOSAMINE SULFATE PO Take 1 tablet by mouth daily.   methocarbamol 500 MG tablet Commonly known as:  ROBAXIN Take 1 tablet (500 mg total) by mouth every 6 (six) hours as needed for muscle spasms.   milk thistle 175 MG tablet Take 175 mg by mouth daily.  oxyCODONE-acetaminophen 5-325 MG tablet Commonly known as:  ROXICET Take 1-2 tablets by mouth every 4 (four) hours as needed.   pyridoxine 100 MG tablet Commonly known as:  B-6 Take 50 mg by mouth daily.   vitamin B-12 1000 MCG tablet Commonly known as:  CYANOCOBALAMIN Take 1,000 mcg by mouth daily.   zinc gluconate 50 MG tablet Take 25 mg by mouth daily.      No Known Allergies    The results of significant diagnostics from this hospitalization (including imaging, microbiology, ancillary and  laboratory) are listed below for reference.    Significant Diagnostic Studies: Dg Elbow Complete Left  Result Date: 08/20/2017 CLINICAL DATA:  Left elbow pain. EXAM: LEFT ELBOW - COMPLETE 3+ VIEW COMPARISON:  None. FINDINGS: Study limited by suboptimal positioning. Within this limitation, no gross fracture or dislocation is evident. Degenerative changes are noted. There is soft tissue swelling around the elbow although no definite fat pad elevation can be identified on the limited lateral projection. IMPRESSION: Degenerative changes without evidence for an acute fracture or dislocation. Soft tissue swelling without definite fat pad elevation. Electronically Signed   By: Misty Stanley M.D.   On: 08/20/2017 16:18   Dg Wrist Complete Left  Result Date: 08/20/2017 CLINICAL DATA:  Wrist pain with no known injury. The patient awakened this morning with wrist pain. History of previous wrist fracture. EXAM: LEFT WRIST - COMPLETE 3+ VIEW COMPARISON:  None in PACs FINDINGS: The bones are subjectively adequately mineralized. There is a well-circumscribed ovoid subcentimeter lucency in the distal ulna. There is a similar-appearing but larger well-circumscribed lucency in the base of the second metacarpal. The joint spaces are well maintained. There is no acute or healing fracture. There is some irregularity of the radiocarpal articulation. The soft tissues exhibit no acute abnormality. Specific attention to the scaphoid reveals no abnormality. IMPRESSION: No significant arthropathy nor evidence of acute fracture of the wrist. There is mild spurring of the radioulnar articulation consistent with mild osteoarthritis. Well-circumscribed benign-appearing cystic changes in the base of the second metacarpal and in the distal ulna. Electronically Signed   By: David  Martinique M.D.   On: 08/20/2017 14:10   Dg Pelvis Portable  Result Date: 08/10/2017 CLINICAL DATA:  Status post left hip arthroplasty. EXAM: PORTABLE PELVIS  1-2 VIEWS COMPARISON:  Operative images obtained earlier today. FINDINGS: Total left hip arthroplasty components appear well seated and well aligned on the single AP view. There is no acute fracture or evidence of an operative complication. IMPRESSION: Well-positioned left hip total arthroplasty. Electronically Signed   By: Lajean Manes M.D.   On: 08/10/2017 13:11   Dg C-arm 1-60 Min-no Report  Result Date: 08/10/2017 Fluoroscopy was utilized by the requesting physician.  No radiographic interpretation.   Dg Hip Operative Unilat With Pelvis Left  Result Date: 08/10/2017 CLINICAL DATA:  Intra op left side anterior approach total hip replacement. Hx of OA. EXAM: OPERATIVE left HIP (WITH PELVIS IF PERFORMED) 2 VIEWS TECHNIQUE: Fluoroscopic spot image(s) were submitted for interpretation post-operatively. COMPARISON:  None. FINDINGS: Two intraoperative fluoroscopic images are provided. Left hip arthroplasty hardware appears intact and appropriately positioned. Adjacent osseous structures appear anatomic in alignment. Fluoroscopy provided for 38 seconds.  2.78 mGy IMPRESSION: Intraoperative fluoroscopic images demonstrating left hip arthroplasty. Hardware appears intact and appropriately positioned. No evidence of surgical complicating feature. Electronically Signed   By: Franki Cabot M.D.   On: 08/10/2017 11:49    Microbiology: Recent Results (from the past 240 hour(s))  Urine culture  Status: Abnormal   Collection Time: 08/20/17  4:00 AM  Result Value Ref Range Status   Specimen Description URINE, CLEAN CATCH  Final   Special Requests NONE  Final   Culture <10,000 COLONIES/mL INSIGNIFICANT GROWTH (A)  Final   Report Status 08/22/2017 FINAL  Final  Blood culture (routine x 2)     Status: None (Preliminary result)   Collection Time: 08/20/17  6:37 PM  Result Value Ref Range Status   Specimen Description BLOOD LEFT FOREARM  Final   Special Requests   Final    BOTTLES DRAWN AEROBIC AND  ANAEROBIC Blood Culture adequate volume   Culture NO GROWTH 3 DAYS  Final   Report Status PENDING  Incomplete  Blood culture (routine x 2)     Status: None (Preliminary result)   Collection Time: 08/20/17  6:39 PM  Result Value Ref Range Status   Specimen Description BLOOD RIGHT HAND  Final   Special Requests   Final    BOTTLES DRAWN AEROBIC AND ANAEROBIC Blood Culture adequate volume   Culture NO GROWTH 3 DAYS  Final   Report Status PENDING  Incomplete     Labs: Basic Metabolic Panel: Recent Labs  Lab 08/20/17 1324 08/21/17 0600  NA 132* 135  K 3.4* 3.6  CL 99* 104  CO2 24 25  GLUCOSE 130* 103*  BUN 19 14  CREATININE 0.76 0.72  CALCIUM 9.1 8.2*   Liver Function Tests: No results for input(s): AST, ALT, ALKPHOS, BILITOT, PROT, ALBUMIN in the last 168 hours. No results for input(s): LIPASE, AMYLASE in the last 168 hours. No results for input(s): AMMONIA in the last 168 hours. CBC: Recent Labs  Lab 08/20/17 1324 08/21/17 0600 08/22/17 0842  WBC 10.7* 8.5 8.9  NEUTROABS 9.2* 6.9 7.0  HGB 9.9* 9.6* 9.5*  HCT 29.6* 29.0* 28.8*  MCV 96.4 98.0 97.3  PLT 374 326 351   Cardiac Enzymes: No results for input(s): CKTOTAL, CKMB, CKMBINDEX, TROPONINI in the last 168 hours. BNP: BNP (last 3 results) No results for input(s): BNP in the last 8760 hours.  ProBNP (last 3 results) No results for input(s): PROBNP in the last 8760 hours.  CBG: Recent Labs  Lab 08/21/17 0827 08/22/17 0501 08/22/17 0804 08/23/17 0739 08/24/17 0741  GLUCAP 146* 108* 105* 89 82       Signed:  Nita Sells MD   [facility] 08/24/2017, 9:00 AM

## 2017-08-24 NOTE — Progress Notes (Addendum)
The nurse heard Pt.  slummed the door to shut.The nurse open it and asked him if he is okay.He stated that he is tiered and sick of this  hospital ,hospitals should be quiet and supposed to have good time.The nurse apologized for what happen and left the room closing the door behind.After a while he opened the door and asked the nurse  if I hear the bell ringing in his room.The nurse replied  Enter the his room and replied "no,i don't hear anything".He complained about the this hospital does not promote sleep and he is upset about it.

## 2017-08-24 NOTE — Progress Notes (Signed)
Patient was given discharge instructions and made aware of medication changes. Patient was given assistance to get home. Patient safely ambulated off unit in stable condition with nursing staff.

## 2017-08-24 NOTE — Evaluation (Signed)
Occupational Therapy Evaluation and Discharge Patient Details Name: Danny Bender MRN: [id number] DOB: [birth year] Today's Date: 08/24/2017    History of Present Illness Pt is a 62 y/o male admitted secondary to LUE swelling at the elbow and hand. Once admitted, R elbow began to swell as well. Per notes, suspected gout. Pt with recent L THA, direct anterior approach on 12/21. PMH includes chronic back pain, skin cancer, and L THA.    Clinical Impression   Pt demonstrated ability to perform ADL and ADL transfers modified independently. Pt not receptive to any education on safety or techniques to make ADL easier. Pt angry that OT asked pt to demonstrate skills rather than simply describe them, which he attempted at length. Pt with questionable safety, releases walker and attempts to walk without and attempted to don pants in standing. No further OT needs.    Follow Up Recommendations  No OT follow up    Equipment Recommendations  None recommended by OT    Recommendations for Other Services       Precautions / Restrictions Precautions Precautions: None Restrictions LLE Weight Bearing: Weight bearing as tolerated      Mobility Bed Mobility               General bed mobility comments: pt in chair  Transfers Overall transfer level: Modified independent Equipment used: Rolling walker (2 wheeled)             General transfer comment: pt releasing walker in standing, but able to use L arm without difficulty on walker and much safer with walker.    Balance     Sitting balance-Leahy Scale: Good       Standing balance-Leahy Scale: Fair Standing balance comment: Able to maintain static standing without UE support.                            ADL either performed or assessed with clinical judgement   ADL Overall ADL's : Modified independent                                       General ADL Comments: Pt demonstrated ability to toilet,  groom and donn clothing modified independently. Pt angry that OT asked him to demonstrate his skills rather than simply describe it.     Vision Baseline Vision/History: Wears glasses Wears Glasses: Reading only Patient Visual Report: No change from baseline       Perception     Praxis      Pertinent Vitals/Pain Pain Assessment: Faces Faces Pain Scale: Hurts little more Pain Location: back Pain Descriptors / Indicators: Grimacing;Guarding Pain Intervention(s): Monitored during session;Repositioned     Hand Dominance Right   Extremity/Trunk Assessment Upper Extremity Assessment Upper Extremity Assessment: LUE deficits/detail LUE Deficits / Details: edematous, but using effectively for ADL   Lower Extremity Assessment Lower Extremity Assessment: Defer to PT evaluation   Cervical / Trunk Assessment Cervical / Trunk Assessment: Other exceptions Cervical / Trunk Exceptions: chronic back pain   Communication Communication Communication: No difficulties   Cognition Arousal/Alertness: Awake/alert Behavior During Therapy: WFL for tasks assessed/performed Overall Cognitive Status: Impaired/Different from baseline Area of Impairment: Safety/judgement                         Safety/Judgement: Decreased awareness of safety     General Comments:  pt attempting to don pants in standing despite cues, but then realized sitting was necessary   General Comments       Exercises     Shoulder Instructions      Home Living Family/patient expects to be discharged to:: Private residence Living Arrangements: Alone Available Help at Discharge: Family;Available PRN/intermittently Type of Home: House Home Access: Stairs to enter [employer] of Steps: 3-4 Entrance Stairs-Rails: Left Home Layout: Two level;1/2 bath on main level Alternate Level Stairs-Number of Steps: flight with landing   Bathroom Shower/Tub: Occupational psychologist: Standard      Home Equipment: Cane - single point;Walker - 2 wheels;Bedside commode   Additional Comments: brother is still in town      Prior Functioning/Environment Level of Independence: Independent with assistive device(s)        Comments: Reports he has been using RW at home since THA and avoiding LB dressing.        OT Problem List:        OT Treatment/Interventions:      OT Goals(Current goals can be found in the care plan section) Acute Rehab OT Goals Patient Stated Goal: to get better   OT Frequency:     Barriers to D/C:            Co-evaluation              AM-PAC PT "6 Clicks" Daily Activity     Outcome Measure Help from another person eating meals?: None Help from another person taking care of personal grooming?: None Help from another person toileting, which includes using toliet, bedpan, or urinal?: None Help from another person bathing (including washing, rinsing, drying)?: None Help from another person to put on and taking off regular upper body clothing?: None Help from another person to put on and taking off regular lower body clothing?: None 6 Click Score: 24   End of Session Equipment Utilized During Treatment: Rolling walker  Activity Tolerance: Patient tolerated treatment well Patient left: in chair;with call bell/phone within reach  OT Visit Diagnosis: Unsteadiness on feet (R26.81);Pain                Time: 0940-1020 OT Time Calculation (min): 40 min Charges:  OT General Charges $OT Visit: 1 Visit OT Evaluation $OT Eval Low Complexity: 1 Low OT Treatments $Self Care/Home Management : 23-37 mins G-Codes:     09-21-17 Nestor Lewandowsky, OTR/L Pager: [phone] Byren Pankow, Haze Boyden Sep 21, 2017, 10:26 AM

## 2017-08-24 NOTE — Social Work (Signed)
CSW consulted to provide transportation support for pt.   Pt family and 82 of friends are out of state. Pt does not have neighbors or friends that can pick him up. Pt brother visited from out of state and brought pt keys, clothes, and his insurance card but no way to pay.   CSW able to provide cab voucher for pt to get home, approved by Nathaniel Man, CSW.   CSW signing off. Please consult if any additional needs arise.  Alexander Mt, [place] Work [phone]

## 2017-08-25 LAB — CULTURE, BLOOD (ROUTINE X 2)
Culture: NO GROWTH
Culture: NO GROWTH
SPECIAL REQUESTS: ADEQUATE
Special Requests: ADEQUATE

## 2017-08-27 ENCOUNTER — Telehealth (INDEPENDENT_AMBULATORY_CARE_PROVIDER_SITE_OTHER): Payer: Self-pay | Admitting: Orthopaedic Surgery

## 2017-08-27 NOTE — Telephone Encounter (Signed)
Danny Bender will go back to his home for more therapy

## 2017-08-27 NOTE — Telephone Encounter (Signed)
It is okay to set him up for outpatient therapy to work on balance and coordination and gait training.

## 2017-08-27 NOTE — Telephone Encounter (Signed)
confusing message, but assuming maybe he needs outpatient therapy

## 2017-08-27 NOTE — Telephone Encounter (Signed)
Patient needs to know what next steps are for him. He states he had some help in his home but those people had to go back out of state, he was told to follow up with PCP but he is currently out on vacation, and also he was asking about therapy. If you could call back to advise: # [phone]

## 2017-09-10 ENCOUNTER — Other Ambulatory Visit (INDEPENDENT_AMBULATORY_CARE_PROVIDER_SITE_OTHER): Payer: Self-pay

## 2017-09-10 ENCOUNTER — Ambulatory Visit (INDEPENDENT_AMBULATORY_CARE_PROVIDER_SITE_OTHER): Payer: BLUE CROSS/BLUE SHIELD | Admitting: Orthopaedic Surgery

## 2017-09-10 ENCOUNTER — Encounter (INDEPENDENT_AMBULATORY_CARE_PROVIDER_SITE_OTHER): Payer: Self-pay | Admitting: Orthopaedic Surgery

## 2017-09-10 DIAGNOSIS — Z96642 Presence of left artificial hip joint: Secondary | ICD-10-CM

## 2017-09-10 DIAGNOSIS — M79602 Pain in left arm: Secondary | ICD-10-CM

## 2017-09-10 NOTE — Progress Notes (Signed)
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  His postoperative course was complicated by readmission for some type of vague left upper extremity arm swelling.  He had no cellulitis but he was treated for that.  He had a normal white blood cell count and a normal uric acid levels.  That resolved and he was discharged.  He still complains of numbness and tingling in that hand is been kind of a chronic process and its in the ulnar nerve distribution.  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  As far as his left hip goes his incision is healed nicely.  He is ambulating without assistive device and no significant limp.  His leg lengths are equal.  At this point we will set him up for [facility] conduction velocity studies to assess the left upper extremity for potential cubital tunnel syndrome.  I will see him back in 2 weeks to see how is doing overall.  And to hopefully review the studies with him.

## 2017-09-26 ENCOUNTER — Telehealth (INDEPENDENT_AMBULATORY_CARE_PROVIDER_SITE_OTHER): Payer: Self-pay | Admitting: Orthopaedic Surgery

## 2017-09-26 ENCOUNTER — Encounter (INDEPENDENT_AMBULATORY_CARE_PROVIDER_SITE_OTHER): Payer: Self-pay

## 2017-09-26 ENCOUNTER — Ambulatory Visit (INDEPENDENT_AMBULATORY_CARE_PROVIDER_SITE_OTHER): Payer: BLUE CROSS/BLUE SHIELD | Admitting: Orthopaedic Surgery

## 2017-09-26 DIAGNOSIS — Z96642 Presence of left artificial hip joint: Secondary | ICD-10-CM

## 2017-09-26 NOTE — Telephone Encounter (Signed)
Please call pt to schedule appt. Pt came into the office and was upset due to not being able to be seen. Staff did let pt know he could get a refund on his copay pt refused and stated it was a dotation to our practice.

## 2017-09-27 NOTE — Progress Notes (Signed)
Was not seen.

## 2017-10-01 ENCOUNTER — Ambulatory Visit (INDEPENDENT_AMBULATORY_CARE_PROVIDER_SITE_OTHER): Payer: BLUE CROSS/BLUE SHIELD | Admitting: Physician Assistant

## 2017-10-05 ENCOUNTER — Telehealth (INDEPENDENT_AMBULATORY_CARE_PROVIDER_SITE_OTHER): Payer: Self-pay | Admitting: *Deleted

## 2017-10-05 NOTE — Telephone Encounter (Signed)
[facility] fotr patient letting him know we are unsure what he means

## 2017-10-05 NOTE — Telephone Encounter (Signed)
Not sure what he is talking about in terms of his hip.  Nothing else to do post his hip replacement.  Try to find out what he means.

## 2017-10-09 ENCOUNTER — Telehealth (INDEPENDENT_AMBULATORY_CARE_PROVIDER_SITE_OTHER): Payer: Self-pay | Admitting: Orthopaedic Surgery

## 2017-10-09 NOTE — Telephone Encounter (Signed)
Error - did not want me to send message

## 2018-05-09 ENCOUNTER — Telehealth (INDEPENDENT_AMBULATORY_CARE_PROVIDER_SITE_OTHER): Payer: Self-pay | Admitting: Orthopaedic Surgery

## 2018-05-09 NOTE — Telephone Encounter (Signed)
Patient called advised he still have swelling in his arms and hands. Patient asked if Dr [name] do test to find out if patient have pseudo gout and uric acid. The number to contact patient is [phone]

## 2018-05-10 ENCOUNTER — Telehealth (INDEPENDENT_AMBULATORY_CARE_PROVIDER_SITE_OTHER): Payer: Self-pay

## 2018-05-10 NOTE — Telephone Encounter (Signed)
FYI-  Patient called to see if Caryl Pina M.was available to talk with him concerning a test for Pseudo Gout and Uric Acid.  Advised patient that she is not in the office today and a message was sent to her on yesterday, 05/09/2018.  Patient stated that he will be seeing another doctor on Friday 05/17/2018, but would have to see an orthopedic doctor for those 2 test.  Patient asked why would he have to see an Orthopedic doctor to have a test done for Pseudo gout and Uric acid?  Advised patient that I was not sure as to why he was told that, but I could schedule him to see Erskine Emery, PA to Dr. [name] on Monday, 05/13/2018, but patient stated that Dr. [name] is never available and would have liked to have seen him after his surgery for left hip replacement.   Reviewed patient's chart and patient has no showed and canceled some appointment's.  I did advise patient that labs would have to be drawn for Pseudo Gout and Uric Acid, but patient stated that he would call back next week when our office is not so busy to speak with Caryl Pina M.concerning test for Pseudo gout and Uric Acid.

## 2018-05-10 NOTE — Telephone Encounter (Signed)
See other message. Other message was routed to [place].

## 2018-05-16 NOTE — Telephone Encounter (Signed)
LMOM for patient letting him know I was waiting for his call and haven't heard from him, so I was just checking in with him to see if he still wanted to talk to me?

## 2018-08-27 ENCOUNTER — Other Ambulatory Visit: Payer: Self-pay

## 2018-08-27 ENCOUNTER — Encounter: Payer: Self-pay | Admitting: Emergency Medicine

## 2018-08-27 ENCOUNTER — Ambulatory Visit (INDEPENDENT_AMBULATORY_CARE_PROVIDER_SITE_OTHER): Payer: BLUE CROSS/BLUE SHIELD | Admitting: Emergency Medicine

## 2018-08-27 VITALS — BP 167/78 | HR 51 | Temp 98.3°F | Resp 16 | Ht 67.0 in | Wt 159.2 lb

## 2018-08-27 DIAGNOSIS — M25552 Pain in left hip: Secondary | ICD-10-CM

## 2018-08-27 DIAGNOSIS — G8929 Other chronic pain: Secondary | ICD-10-CM | POA: Diagnosis not present

## 2018-08-27 DIAGNOSIS — Z8739 Personal history of other diseases of the musculoskeletal system and connective tissue: Secondary | ICD-10-CM | POA: Insufficient documentation

## 2018-08-27 DIAGNOSIS — Z7689 Persons encountering health services in other specified circumstances: Secondary | ICD-10-CM | POA: Diagnosis not present

## 2018-08-27 NOTE — Progress Notes (Signed)
Danny Bender 63 y.o.   Chief Complaint  Patient presents with  . Establish Care    HISTORY OF PRESENT ILLNESS: This is a 63 y.o. male here to establish care.  First visit to this office.  Status post surgery to left hip 2 years ago with chronic pain since.  Has history of gout with normal uric acid levels.  Denies any other chronic medical problems.  Today has no complaints or medical concerns.  Former smoker.  HPI   Prior to Admission medications   Medication Sig Start Date End Date Taking? Authorizing Provider  GARLIC PO Take by mouth daily.   Yes [provider]  GLUCOSAMINE SULFATE PO Take 1 tablet by mouth daily.   Yes [provider]  INDOMETHACIN PO Take 50 mg by mouth as needed.   Yes [provider]  milk thistle 175 MG tablet Take 175 mg by mouth daily.   Yes [provider]  pyridoxine (B-6) 100 MG tablet Take 50 mg by mouth daily.   Yes [provider]  TURMERIC PO Take by mouth daily.   Yes [provider]  vitamin B-12 (CYANOCOBALAMIN) 1000 MCG tablet Take 1,000 mcg by mouth daily.   Yes [provider]  zinc gluconate 50 MG tablet Take 25 mg by mouth daily.   Yes [provider]  acetaminophen (TYLENOL) 325 MG tablet Take 2 tablets (650 mg total) by mouth every 6 (six) hours as needed for mild pain (or Fever >/= 101). Patient not taking: Reported on 08/27/2018 08/24/17   Nita Sells, MD  aspirin EC 325 MG EC tablet Take 1 tablet (325 mg total) by mouth daily with breakfast. Patient not taking: Reported on 08/27/2018 08/11/17   Mcarthur Rossetti, MD  colchicine 0.6 MG tablet Take 1 tablet (0.6 mg total) by mouth 2 (two) times daily. Patient not taking: Reported on 08/27/2018 08/24/17   Nita Sells, MD  fluticasone [facility]) 50 MCG/ACT nasal spray Place 1 spray into both nostrils daily.    [provider]  methocarbamol (ROBAXIN) 500 MG tablet Take 1 tablet (500 mg total)  by mouth every 6 (six) hours as needed for muscle spasms. Patient not taking: Reported on 08/27/2018 08/11/17   Mcarthur Rossetti, MD  oxyCODONE-acetaminophen (ROXICET) 5-325 MG tablet Take 1-2 tablets by mouth every 4 (four) hours as needed. Patient not taking: Reported on 08/27/2018 08/11/17   Mcarthur Rossetti, MD    No Known Allergies  Patient Active Problem List   Diagnosis Date Noted  . 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 Cellulitis 08/20/2017  . Status post total replacement of left hip 08/10/2017  . Pain in left hip 04/24/2017  . Unilateral primary osteoarthritis, left hip 04/24/2017    Past Medical History:  Diagnosis Date  . Arthritis    "shoulders, hands, left wrist" (08/22/2017)  . Basal cell carcinoma    "used nitrogen chin, ?right" (08/22/2017)  . Chronic back pain   . Chronic lower back pain   . Complication of anesthesia    took days to clear head  . DDD (degenerative disc disease), cervical   . DDD (degenerative disc disease), lumbar   . Discoloration of skin    fingers and toe   . Family history of adverse reaction to anesthesia    "sister had a problem; don't remember what it was"  . History of shingles   . Nasal polyps   . OA (osteoarthritis) of hip   .  Ruptured disk    lumbar    Past Surgical History:  Procedure Laterality Date  . ABDOMINAL HERNIA REPAIR  2004  . ANTERIOR CERVICAL DECOMP/DISCECTOMY FUSION  1991  . BACK SURGERY    . HERNIA REPAIR    . JOINT REPLACEMENT    . TOTAL HIP ARTHROPLASTY Left 08/10/2017   Procedure: LEFT TOTAL HIP ARTHROPLASTY ANTERIOR APPROACH;  Surgeon: Mcarthur Rossetti, MD;  Location: [facility];  Service: Orthopedics;  Laterality: Left;    Social History   Socioeconomic History  . Marital status: Single    Spouse name: Not on file  . Number of children: Not on file  . Years of education: Not on file  . Highest education level: Not on file  Occupational History  . Not on file    Social Needs  . Financial resource strain: Not on file  . Food insecurity:    Worry: Not on file    Inability: Not on file  . Transportation needs:    Medical: Not on file    Non-medical: Not on file  Tobacco Use  . Smoking status: Former Smoker    Packs/day: 2.00    Years: 10.00    Pack years: 20.00    Types: Cigarettes    Last attempt to quit: 08/21/2002    Years since quitting: 16.0  . Smokeless tobacco: Never Used  Substance and Sexual Activity  . Alcohol use: Yes    Alcohol/week: 7.0 standard drinks    Types: 7 Standard drinks or equivalent per week  . Drug use: Yes    Types: Marijuana    Comment: 08/22/2017 "just started recently; to help w/pain"  . Sexual activity: Not on file  Lifestyle  . Physical activity:    Days per week: Not on file    Minutes per session: Not on file  . Stress: Not on file  Relationships  . Social connections:    Talks on phone: Not on file    Gets together: Not on file    Attends religious service: Not on file    Active member of club or organization: Not on file    Attends meetings of clubs or organizations: Not on file    Relationship status: Not on file  . Intimate partner violence:    Fear of current or ex partner: Not on file    Emotionally abused: Not on file    Physically abused: Not on file    Forced sexual activity: Not on file  Other Topics Concern  . Not on file  Social History Narrative  . Not on file    No family history on file.   Review of Systems  Constitutional: Negative.  Negative for chills, fever and weight loss.  HENT: Negative for sore throat.   Eyes: Negative.   Respiratory: Negative.  Negative for shortness of breath.   Cardiovascular: Negative for chest pain and palpitations.  Gastrointestinal: Negative.  Negative for abdominal pain, diarrhea, nausea and vomiting.  Musculoskeletal: Positive for joint pain.  Skin: Negative for rash.  Neurological: Negative.  Negative for dizziness and headaches.   Endo/Heme/Allergies: Negative.   All other systems reviewed and are negative.  Vitals:   08/27/18 1500  BP: (!) 167/78  Pulse: (!) 51  Resp: 16  Temp: 98.3 F (36.8 C)  SpO2: 100%     Physical Exam Constitutional:      Appearance: Normal appearance.  HENT:     Head: Normocephalic.  Eyes:     Extraocular Movements:  Extraocular movements intact.     Pupils: Pupils are equal, round, and reactive to light.  Neck:     Musculoskeletal: Normal range of motion.  Cardiovascular:     Rate and Rhythm: Normal rate.  Pulmonary:     Effort: Pulmonary effort is normal.  Musculoskeletal: Normal range of motion.  Skin:    General: Skin is warm.  Neurological:     General: No focal deficit present.     Mental Status: He is alert and oriented to person, place, and time.  Psychiatric:        Mood and Affect: Mood normal.        Behavior: Behavior normal.      ASSESSMENT & PLAN: Colton was seen today for establish care.  Diagnoses and all orders for this visit:  Chronic left hip pain  Encounter to establish care  History of gout    Patient Instructions       If you have lab work done today you will be contacted with your lab results within the next 2 weeks.  If you have not heard from [place] then please contact us. The fastest way to get your results is to register for My Chart.   IF you received an x-ray today, you will receive an invoice from [facility]. Please contact [facility] at [phone] with questions or concerns regarding your invoice.   IF you received labwork today, you will receive an invoice from [facility]. Please contact [facility] at [phone] with questions or concerns regarding your invoice.   Our billing staff will not be able to assist you with questions regarding bills from these companies.  You will be contacted with the lab results as soon as they are available. The fastest way to get your results is to activate your My  Chart account. Instructions are located on the last page of this paperwork. If you have not heard from [place] regarding the results in 2 weeks, please contact this office.     Health Maintenance, Male A healthy lifestyle and preventive care is important for your health and wellness. Ask your health care provider about what schedule of regular examinations is right for you. What should I know about weight and diet? Eat a Healthy Diet  Eat plenty of vegetables, fruits, whole grains, low-fat dairy products, and lean protein.  Do not eat a lot of foods high in solid fats, added sugars, or salt.  Maintain a Healthy Weight Regular exercise can help you achieve or maintain a healthy weight. You should:  Do at least 150 minutes of exercise each week. The exercise should increase your heart rate and make you sweat (moderate-intensity exercise).  Do strength-training exercises at least twice a week. Watch Your Levels of Cholesterol and Blood Lipids  Have your blood tested for lipids and cholesterol every 5 years starting at 65 years of age. If you are at high risk for heart disease, you should start having your blood tested when you are 65 years old. You may need to have your cholesterol levels checked more often if: ? Your lipid or cholesterol levels are high. ? You are older than 65 years of age. ? You are at high risk for heart disease. What should I know about cancer screening? Many types of cancers can be detected early and may often be prevented. Lung Cancer  You should be screened every year for lung cancer if: ? You are a current smoker who has smoked for at least 30 years. ?  You are a former smoker who has quit within the past 15 years.  Talk to your health care provider about your screening options, when you should start screening, and how often you should be screened. Colorectal Cancer  Routine colorectal cancer screening usually begins at 65 years of age and should be repeated every  5-10 years until you are 65 years old. You may need to be screened more often if early forms of precancerous polyps or small growths are found. Your health care provider may recommend screening at an earlier age if you have risk factors for colon cancer.  Your health care provider may recommend using home test kits to check for hidden blood in the stool.  A small camera at the end of a tube can be used to examine your colon (sigmoidoscopy or colonoscopy). This checks for the earliest forms of colorectal cancer. Prostate and Testicular Cancer  Depending on your age and overall health, your health care provider may do certain tests to screen for prostate and testicular cancer.  Talk to your health care provider about any symptoms or concerns you have about testicular or prostate cancer. Skin Cancer  Check your skin from head to toe regularly.  Tell your health care provider about any new moles or changes in moles, especially if: ? There is a change in a mole's size, shape, or color. ? You have a mole that is larger than a pencil eraser.  Always use sunscreen. Apply sunscreen liberally and repeat throughout the day.  Protect yourself by wearing long sleeves, pants, a wide-brimmed hat, and sunglasses when outside. What should I know about heart disease, diabetes, and high blood pressure?  If you are 91-53 years of age, have your blood pressure checked every 3-5 years. If you are 20 years of age or older, have your blood pressure checked every year. You should have your blood pressure measured twice-once when you are at a hospital or clinic, and once when you are not at a hospital or clinic. Record the average of the two measurements. To check your blood pressure when you are not at a hospital or clinic, you can use: ? An automated blood pressure machine at a pharmacy. ? A home blood pressure monitor.  Talk to your health care provider about your target blood pressure.  If you are between  27-42 years old, ask your health care provider if you should take aspirin to prevent heart disease.  Have regular diabetes screenings by checking your fasting blood sugar level. ? If you are at a normal weight and have a low risk for diabetes, have this test once every three years after the age of 21. ? If you are overweight and have a high risk for diabetes, consider being tested at a younger age or more often.  A one-time screening for abdominal aortic aneurysm (AAA) by ultrasound is recommended for men aged 74-75 years who are current or former smokers. What should I know about preventing infection? Hepatitis B If you have a higher risk for hepatitis B, you should be screened for this virus. Talk with your health care provider to find out if you are at risk for hepatitis B infection. Hepatitis C Blood testing is recommended for:  Everyone born from [birth year] through 1967.  Anyone with known risk factors for hepatitis C. Sexually Transmitted Diseases (STDs)  You should be screened each year for STDs including gonorrhea and chlamydia if: ? You are sexually active and are younger than 65 years of  age. ? You are older than 65 years of age and your health care provider tells you that you are at risk for this type of infection. ? Your sexual activity has changed since you were last screened and you are at an increased risk for chlamydia or gonorrhea. Ask your health care provider if you are at risk.  Talk with your health care provider about whether you are at high risk of being infected with HIV. Your health care provider may recommend a prescription medicine to help prevent HIV infection. What else can I do?  Schedule regular health, dental, and eye exams.  Stay current with your vaccines (immunizations).  Do not use any tobacco products, such as cigarettes, chewing tobacco, and e-cigarettes. If you need help quitting, ask your health care provider.  Limit alcohol intake to no more than 2  drinks per day. One drink equals 12 ounces of beer, 5 ounces of wine, or 1 ounces of hard liquor.  Do not use street drugs.  Do not share needles.  Ask your health care provider for help if you need support or information about quitting drugs.  Tell your health care provider if you often feel depressed.  Tell your health care provider if you have ever been abused or do not feel safe at home. This information is not intended to replace advice given to you by your health care provider. Make sure you discuss any questions you have with your health care provider. Document Released: 02/03/2008 Document Revised: 04/05/2016 Document Reviewed: 05/11/2015 Elsevier Interactive Patient Education  2019 Elsevier Inc.      Agustina Caroli, MD [facility]

## 2018-08-27 NOTE — Patient Instructions (Addendum)

## 2018-10-11 ENCOUNTER — Telehealth: Payer: Self-pay | Admitting: Emergency Medicine

## 2018-10-11 NOTE — Telephone Encounter (Signed)
Copied from [place] [phone]. Topic: General - Other >> Oct 11, 2018 12:58 PM Jackey Loge, Lenna Sciara wrote: Reason for CRM: Patient is upset that he was told that the office takes his Insurance and went for appt and he was not in network he wants to talk to someone and find out why he was seen if Insurance was not taken.  Best call back is [phone]

## 2020-05-24 ENCOUNTER — Other Ambulatory Visit: Payer: Self-pay

## 2020-05-24 ENCOUNTER — Ambulatory Visit: Payer: Self-pay

## 2020-05-24 ENCOUNTER — Encounter: Payer: Self-pay | Admitting: Physician Assistant

## 2020-05-24 ENCOUNTER — Ambulatory Visit (INDEPENDENT_AMBULATORY_CARE_PROVIDER_SITE_OTHER): Payer: Medicare Other | Admitting: Physician Assistant

## 2020-05-24 DIAGNOSIS — M5441 Lumbago with sciatica, right side: Secondary | ICD-10-CM

## 2020-05-24 DIAGNOSIS — G8929 Other chronic pain: Secondary | ICD-10-CM

## 2020-05-24 NOTE — Progress Notes (Signed)
Office Visit Note   Patient: Danny Bender           Date of Birth: 03/23/1956           MRN: [id number] Visit Date: 05/24/2020              Requested by: [name], MD [address] [place],  [place] [address] PCP: [name], MD   Assessment & Plan: Visit Diagnoses:  1. Chronic right-sided low back pain with right-sided sciatica     Plan: Due to the fact that patient does not tolerate Dosepak and has had pain ongoing for more than 3 years recommend MRI of the L-spine rule out HNP as a source of his radicular pain down the left leg.  We will see him back after the MRI to go over results and discuss further treatment.  Questions encouraged and answered at length.  Follow-Up Instructions: Return AFTER MRI.   Orders:  Orders Placed This Encounter  Procedures  . XR Lumbar Spine 2-3 Views   No orders of the defined types were placed in this encounter.     Procedures: No procedures performed   Clinical Data: No additional findings.   Subjective: Chief Complaint  Patient presents with  . Lower Back - Pain    HPI Mr. Danny Bender General is a 64 year old male who we have not seen for some time.  The last seen after his left total hip arthroplasty 08/10/2017.  However he seen Dr. Sharol Given since that time.  He states he has had right low back pain since undergoing the left total hip arthroplasty.  He states his pain has been worse over the last week.  Pain radiates down the right leg into the lower leg and occasionally into the right foot.  He describes a constant pain.  Causes him to stumble at times because the leg feels weak.  He has been taking some leftover Robaxin and oxycodone that he had .  He denies any waking pain ,bowel bladder dysfunction, saddle anesthesia symptoms, fevers or chills.  Patient reports that he has been doing home exercise program whether she has included back exercises inversion table and planks without any real relief of his low back pain and  radicular symptoms down the right leg.  Review of Systems Please see HPI  Objective: Vital Signs: There were no vitals taken for this visit.  Physical Exam Constitutional:      Appearance: He is normal weight. He is not ill-appearing or diaphoretic.  Pulmonary:     Effort: Pulmonary effort is normal.  Neurological:     Mental Status: He is alert and oriented to person, place, and time.  Psychiatric:        Mood and Affect: Mood normal.     Ortho Exam Bilateral lower extremities 5 out of 5 strengths against resistance.  Straight leg raise reveals tight hamstrings with negative straight leg raise.  Good range of motion bilateral hips without pain.  Deep tendon reflexes are 2+ knees and ankles and equal and symmetric.  Sensation intact bilateral feet light touch dorsal pedal pulses are intact bilateral feet.  Specialty Comments:  No specialty comments available.  Imaging: XR Lumbar Spine 2-3 Views  Result Date: 05/24/2020 Lumbar spine 2 views shows loss of lordotic curvature.  Degenerative disc disease throughout with vertebral body spurring.  No spondylolisthesis.  No acute fractures.    PMFS History: Patient Active Problem List   Diagnosis Date Noted  . Encounter to establish care  08/27/2018  . History of gout 08/27/2018  . 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 Cellulitis 08/20/2017  . Status post total replacement of left hip 08/10/2017  . Chronic left hip pain 04/24/2017  . Unilateral primary osteoarthritis, left hip 04/24/2017   Past Medical History:  Diagnosis Date  . Arthritis    "shoulders, hands, left wrist" (08/22/2017)  . Basal cell carcinoma    "used nitrogen chin, ?right" (08/22/2017)  . Chronic back pain   . Chronic lower back pain   . Complication of anesthesia    took days to clear head  . DDD (degenerative disc disease), cervical   . DDD (degenerative disc disease), lumbar   . Discoloration of skin    fingers and toe   . Family  history of adverse reaction to anesthesia    "sister had a problem; don't remember what it was"  . History of shingles   . Nasal polyps   . OA (osteoarthritis) of hip   . Ruptured disk    lumbar    History reviewed. No pertinent family history.  Past Surgical History:  Procedure Laterality Date  . ABDOMINAL HERNIA REPAIR  2004  . ANTERIOR CERVICAL DECOMP/DISCECTOMY FUSION  1991  . BACK SURGERY    . HERNIA REPAIR    . JOINT REPLACEMENT    . TOTAL HIP ARTHROPLASTY Left 08/10/2017   Procedure: LEFT TOTAL HIP ARTHROPLASTY ANTERIOR APPROACH;  Surgeon: Mcarthur Rossetti, MD;  Location: [facility];  Service: Orthopedics;  Laterality: Left;   Social History   Occupational History  . Not on file  Tobacco Use  . Smoking status: Former Smoker    Packs/day: 2.00    Years: 10.00    Pack years: 20.00    Types: Cigarettes    Quit date: 08/21/2002    Years since quitting: 17.7  . Smokeless tobacco: Never Used  Vaping Use  . Vaping Use: Never used  Substance and Sexual Activity  . Alcohol use: Yes    Alcohol/week: 7.0 standard drinks    Types: 7 Standard drinks or equivalent per week  . Drug use: Yes    Types: Marijuana    Comment: 08/22/2017 "just started recently; to help w/pain"  . Sexual activity: Not on file

## 2020-06-04 ENCOUNTER — Telehealth: Payer: Self-pay

## 2020-06-04 ENCOUNTER — Telehealth: Payer: Self-pay | Admitting: Orthopaedic Surgery

## 2020-06-04 NOTE — Telephone Encounter (Signed)
Patient called wanting to know is he could hang upside down?  Stated that his back is sore.  Cb# [phone].  Please advise.  Thank you.

## 2020-06-04 NOTE — Telephone Encounter (Signed)
Patient called requesting a call back. Patient has questions about weather he is able to use invisibne boot which inclined patient to hang upside down for back relief. Patient is upset and has been trying to get an answer for some weeks now. Please call patient as soon as possible at [phone]. Please leave detailed message if unanswered call

## 2020-06-04 NOTE — Telephone Encounter (Signed)
Holding for [employer].  April has taken duplicate message and sent to [facility] to advise.

## 2020-06-07 NOTE — Telephone Encounter (Signed)
Duplicate message answered and sent to April.

## 2020-06-07 NOTE — Telephone Encounter (Signed)
Called and left a VM advising patient of message below per Erskine Emery.

## 2020-06-07 NOTE — Telephone Encounter (Signed)
yes

## 2020-06-08 ENCOUNTER — Telehealth: Payer: Self-pay | Admitting: Orthopaedic Surgery

## 2020-06-08 DIAGNOSIS — M5441 Lumbago with sciatica, right side: Secondary | ICD-10-CM

## 2020-06-08 DIAGNOSIS — G8929 Other chronic pain: Secondary | ICD-10-CM

## 2020-06-08 NOTE — Telephone Encounter (Signed)
Pt called stating he never heard back about his MRI and states after having already called to check on this last week he will be moving to a different practice.  [phone]

## 2020-06-08 NOTE — Telephone Encounter (Signed)
Please see message from patient. I see in your office note from 10/4 that you were going to order a MRI, but do not see where it was entered into the system.  Please let me know if there is something that you would like for me to do.

## 2020-06-09 NOTE — Telephone Encounter (Signed)
Please see below. I have entered MRI order, however, Artis Delay originally was ordering on 05/24/2020. Do you think we could get this ASAP?

## 2020-06-09 NOTE — Telephone Encounter (Signed)
Yes please order an MRI of his L spine Thankyou

## 2020-06-09 NOTE — Telephone Encounter (Signed)
Pt wants to see if can go elsewhere since we did not put order in timely, says he has contacted his PCP to see if can send him to another orthopedics. However, I advised him I will send him to imaging in [place] as early as Saturday if allows. Pt will call me back to let me know what he plans to do in reference to getting MRI.

## 2020-06-11 NOTE — Telephone Encounter (Signed)
Called pt and we had a lengthy conversation about getting MRI, I advised pt I can get him scheduled for Saturday (tomorrow) at [place], pt states he doesn't thinkk he can make it and would just stick with his appt that was given to him by [facility] on Nov 11. Pt states he is on cancellation list if they have any sooner openings, pt states [facility] is closer than the [facility] and would wait.

## 2020-07-01 ENCOUNTER — Other Ambulatory Visit: Payer: Self-pay

## 2020-07-01 ENCOUNTER — Ambulatory Visit
Admission: RE | Admit: 2020-07-01 | Discharge: 2020-07-01 | Disposition: A | Discharge status: Home / Self Care | Payer: Medicare Other | Source: Ambulatory Visit | Attending: Physician Assistant | Admitting: Physician Assistant

## 2020-07-01 DIAGNOSIS — G8929 Other chronic pain: Secondary | ICD-10-CM

## 2020-07-01 IMAGING — MR MR LUMBAR SPINE W/O CM
4 of 5 series · 27 of 48 positions shown · non-contrast
Comparison: Lumbar radiographs [DATE]

CLINICAL DATA: Low back pain with radiculopathy. Right leg pain
with numbness and weakness.

EXAM:
MRI LUMBAR SPINE WITHOUT CONTRAST
TECHNIQUE: Multiplanar, multisequence MR imaging of the lumbar spine was
performed. No intravenous contrast was administered.

[Series 3: T2 · sagittal · 4.0mm · 1.09mm/px · 6 of 20 slices shown (1 of 2)]
[im 1/20]
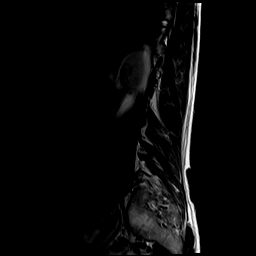
[im 4/20]
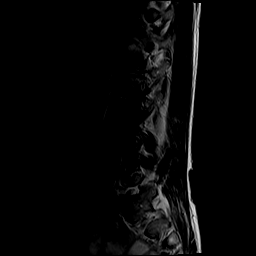
[im 8/20]
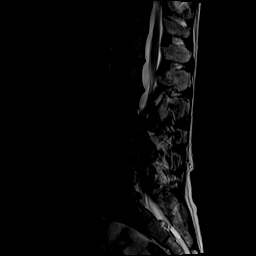
[im 12/20]
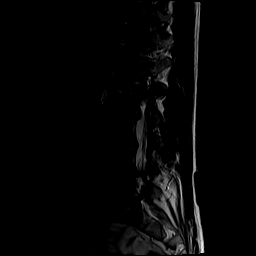
[im 16/20]
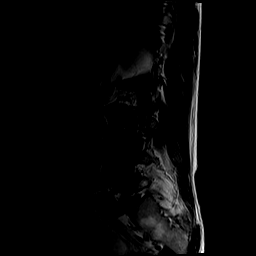
[im 20/20]
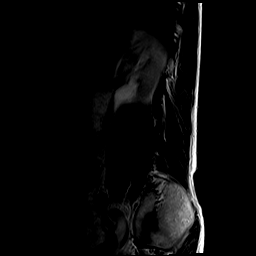

[Series 5: T1 · sagittal · 4.0mm · 1.09mm/px · 6 of 20 slices shown (1 of 2)]
[im 1/20]
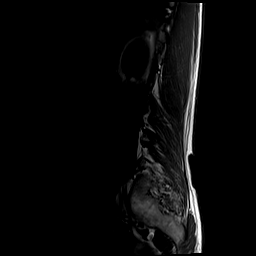
[im 4/20]
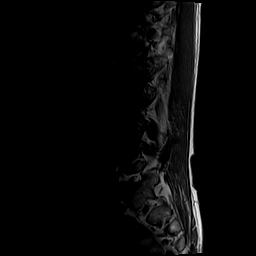
[im 8/20]
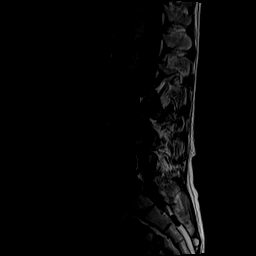
[im 12/20]
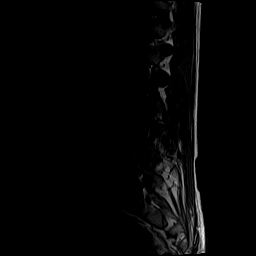
[im 16/20]
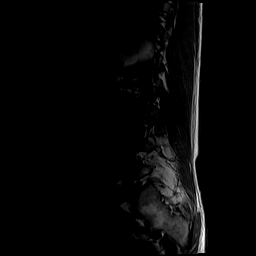
[im 20/20]
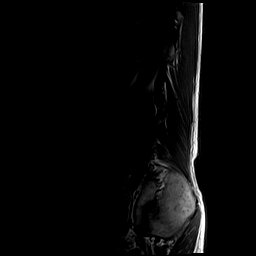

[Series 6: T2 · axial · 4.0mm · 0.39mm/px · z∈[-98,+132]mm · 9 of 46 slices shown (2 of 2)]
[im 1/46]
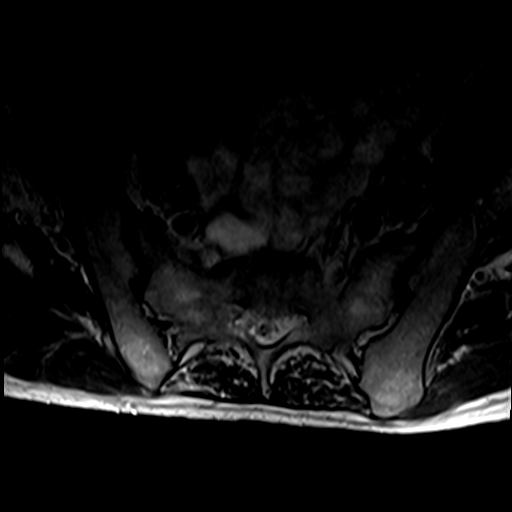
[im 7/46]
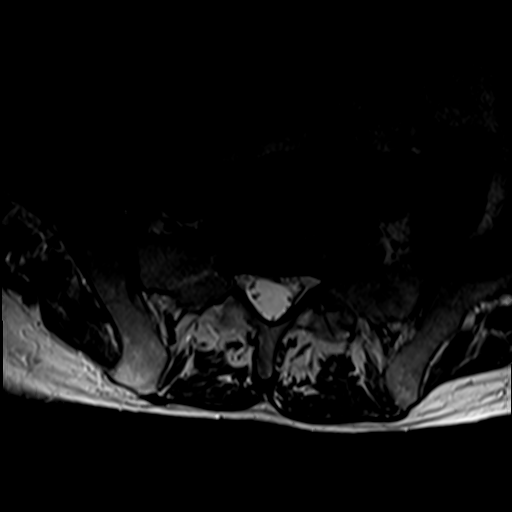
[im 13/46]
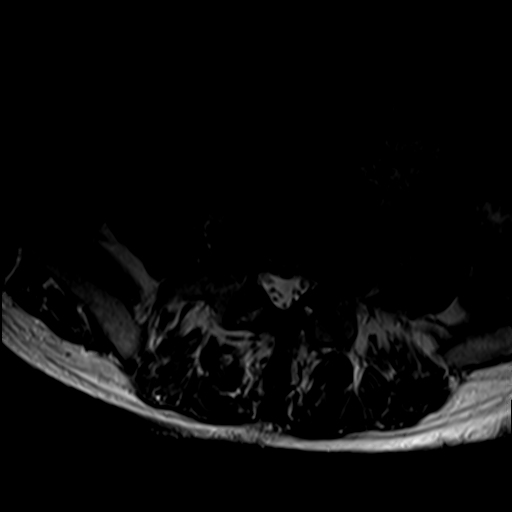
[im 20/46]
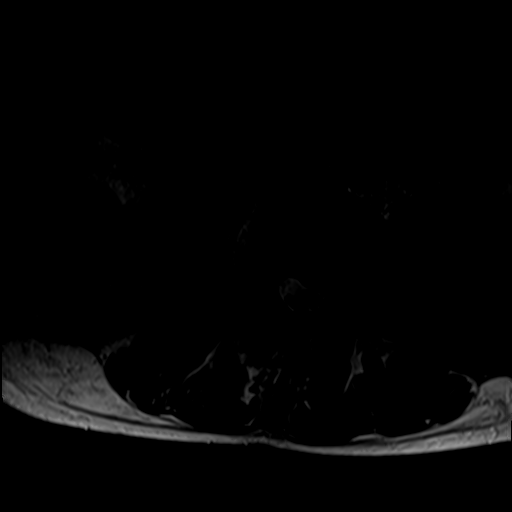
[im 23/46]
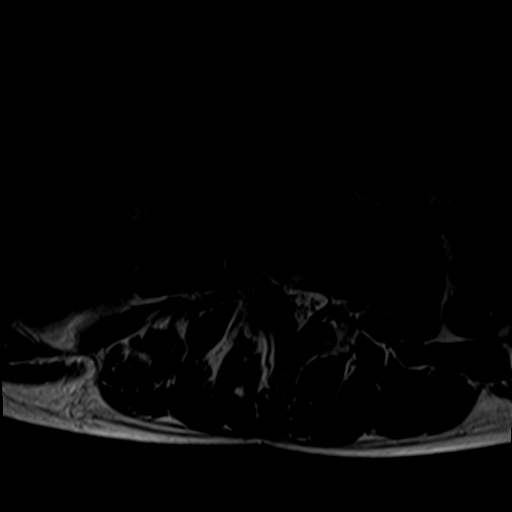
[im 26/46]
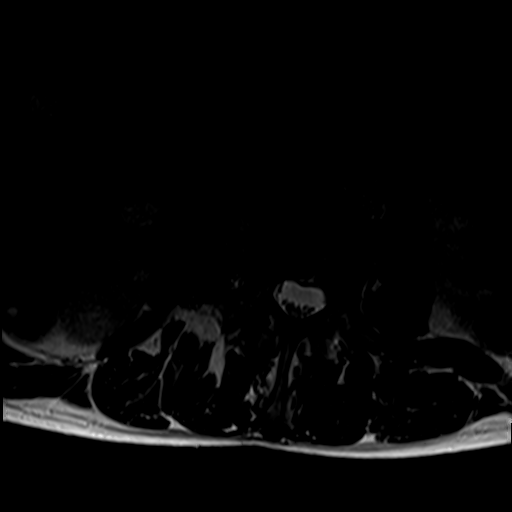
[im 33/46]
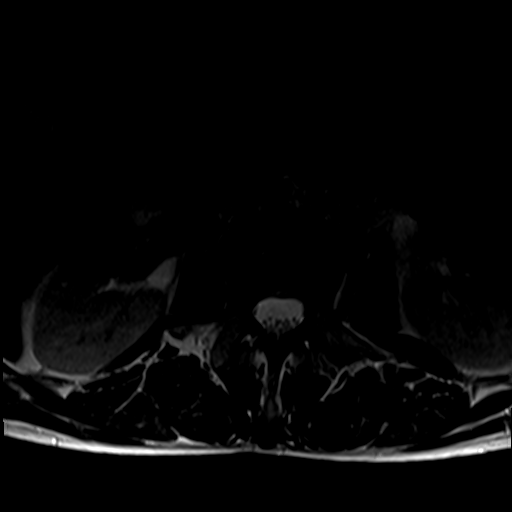
[im 39/46]
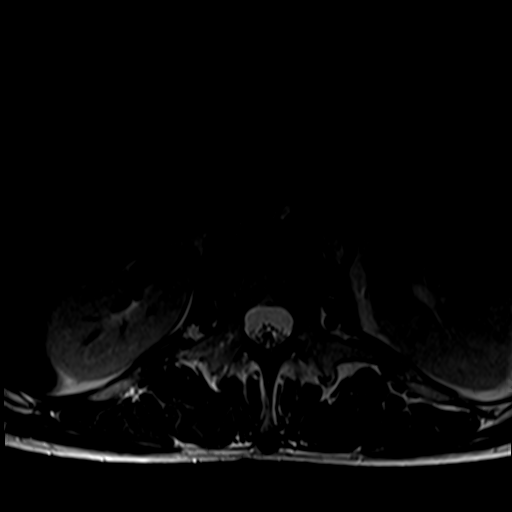
[im 46/46]
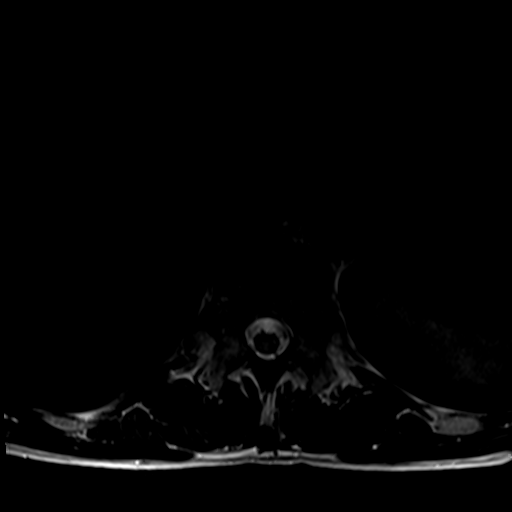

[Series 7: T1 · axial · 4.0mm · 0.39mm/px · z∈[-98,+99]mm · 6 of 46 slices shown (2 of 2)]
[im 1/46]
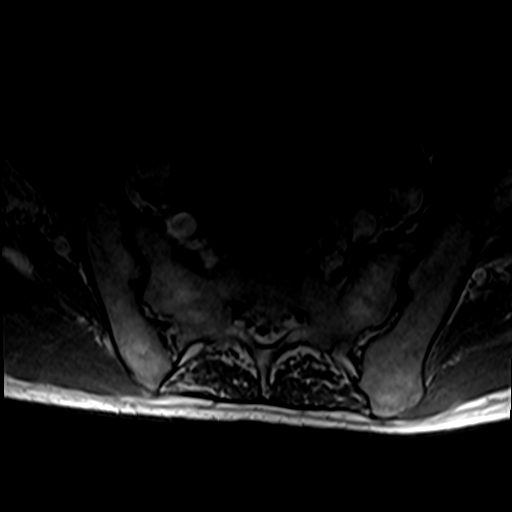
[im 7/46]
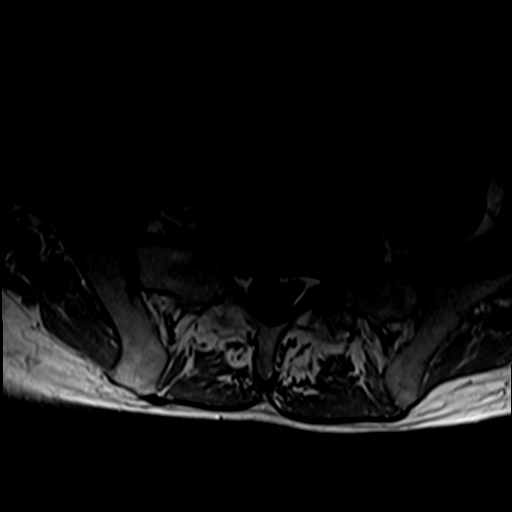
[im 13/46]
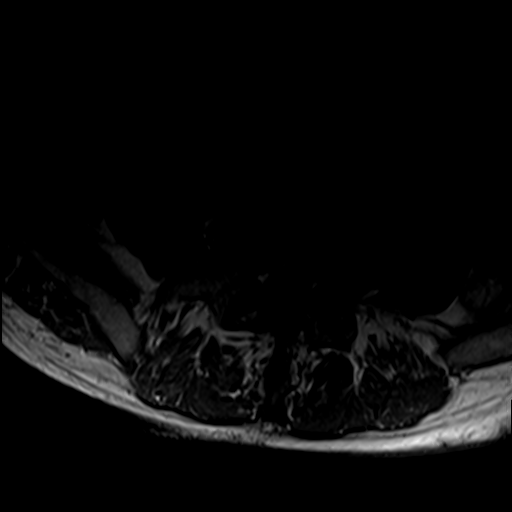
[im 20/46]
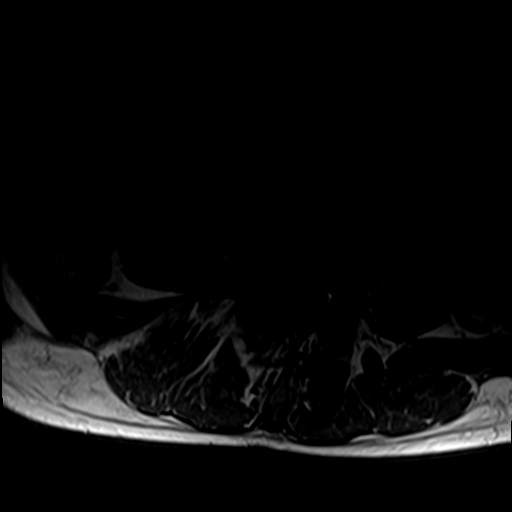
[im 23/46]
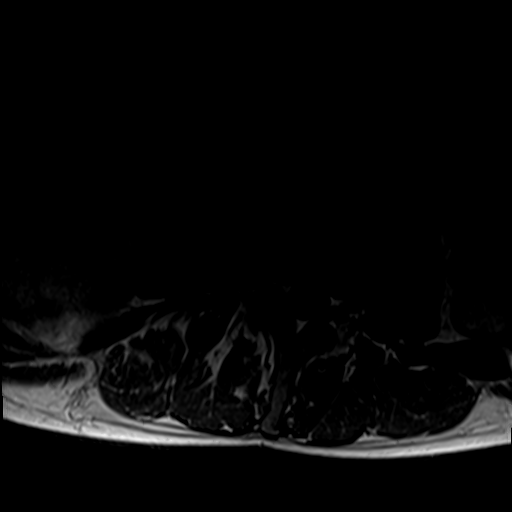
[im 39/46]
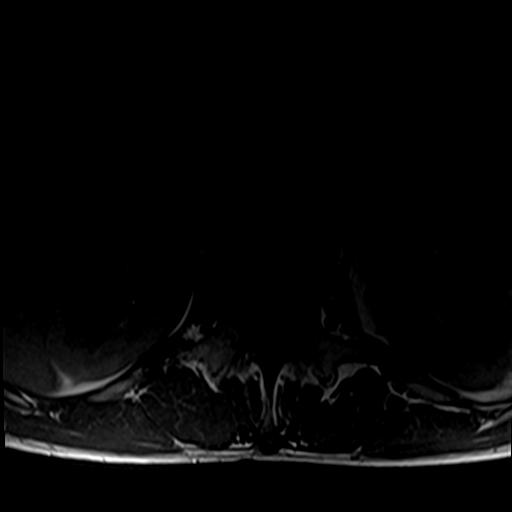

[27 of 48 positions shown; findings below may reference images not displayed]

FINDINGS: Segmentation:  Normal

Alignment:  Mild retrolisthesis L1-2, L2-3.  Mild lumbar scoliosis.

Vertebrae: Negative for fracture or mass. Discogenic edema is
present at multiple levels throughout the lumbar spine related to
disc degeneration.

Conus medullaris and cauda equina: Conus extends to the L1-2 level.
Conus and cauda equina appear normal.

Paraspinal and other soft tissues: Negative for paraspinous mass or
adenopathy

Disc levels:

T12-L1: Asymmetric disc degeneration on the left. No significant
stenosis

L1-2: Moderate to advanced disc degeneration asymmetric to the left.
Mild subarticular stenosis on the left due to spurring. Spinal canal
adequate in size

L2-3: Moderate to advanced disc degeneration with diffuse endplate
spurring and bilateral facet degeneration. Mild spinal stenosis.
Mild to moderate subarticular stenosis bilaterally

L3-4: Asymmetric disc degeneration and spurring on the right. Right
lateral osteophyte as noted on x-ray. Moderate subarticular and
foraminal stenosis on the right due to spurring. Mild facet
degeneration and mild spinal stenosis.

L4-5: Disc degeneration with disc bulging and diffuse endplate
spurring. Bilateral facet degeneration. Moderate subarticular
stenosis bilaterally left greater than right

L5-S1: Moderate disc degeneration with diffuse endplate spurring.
Bilateral facet hypertrophy. Mild to moderate subarticular stenosis
bilaterally.
IMPRESSION: Extensive multilevel degenerative change throughout the lumbar spine
with disc space narrowing and spurring. Multilevel spinal and
foraminal stenosis as above.

## 2020-07-05 ENCOUNTER — Encounter: Payer: Self-pay | Admitting: Orthopaedic Surgery

## 2020-07-05 ENCOUNTER — Ambulatory Visit (INDEPENDENT_AMBULATORY_CARE_PROVIDER_SITE_OTHER): Payer: Medicare Other | Admitting: Orthopaedic Surgery

## 2020-07-05 VITALS — Ht 67.0 in | Wt 159.0 lb

## 2020-07-05 DIAGNOSIS — M5441 Lumbago with sciatica, right side: Secondary | ICD-10-CM | POA: Diagnosis not present

## 2020-07-05 DIAGNOSIS — G8929 Other chronic pain: Secondary | ICD-10-CM

## 2020-07-05 NOTE — Addendum Note (Signed)
Addended by: Lendon Collar on: 07/05/2020 02:45 PM   Modules accepted: Orders

## 2020-07-05 NOTE — Progress Notes (Signed)
The patient comes in to go over an MRI of his lumbar spine.  He is a very thin and active individual and does stretching do routines every day in terms of keeping slim and active.  He does a lot of stretching as well as planks.  He was having a lot of right-sided low back pain with radicular symptoms going down his right leg from the sciatic area to past his knee.  He is now diabetic.  I have remotely replaced his left hip.  He denies any groin pain.  We obtained x-rays of the last visit of the lumbar spine and sent him for a MRI.  He is here to review the MRI today.  He does feel a little bit better than when this started about 6 weeks ago.  He still seems to have positive straight leg raise to the right side with radicular symptoms going down his right leg.  There is no weakness at all and his muscle groups show excellent strength.  The MRI of his lumbar spine does show multilevel degenerative changes throughout the entire lumbar spine.  To the right side it seems like the most affected levels or L1-L2 and L2-L3 but for the radicular components likely L3-L4 and L5-S1.  There is both central and foraminal stenosis.  A lot of this is due to facet hypertrophy.  I would like to send him to Dr. Ernestina Patches for an intervention.  I think this should be a right-sided ESI at either L3-L4 versus L5-S1.  We will work on getting that scheduled and then he will see me back in about 6 weeks.  He can continue his regular exercise routine as well.  He is interested in this treatment plan as well.

## 2020-07-29 ENCOUNTER — Telehealth: Payer: Self-pay | Admitting: Physical Medicine and Rehabilitation

## 2020-07-29 NOTE — Telephone Encounter (Signed)
Pt called stating his back was feeling better. His question is should he still get his epidural shot or should he wait since the pain isn't bad at the moment.. Please call and advise pt of what is the best solution

## 2020-07-29 NOTE — Telephone Encounter (Signed)
Called patient and he will see how he feels over the weekend and call Monday if he is not having much pain and cancel/ postpone injection.

## 2020-08-03 ENCOUNTER — Ambulatory Visit: Payer: Self-pay

## 2020-08-03 ENCOUNTER — Ambulatory Visit (INDEPENDENT_AMBULATORY_CARE_PROVIDER_SITE_OTHER): Payer: Medicare Other | Admitting: Physical Medicine and Rehabilitation

## 2020-08-03 ENCOUNTER — Encounter: Payer: Self-pay | Admitting: Physical Medicine and Rehabilitation

## 2020-08-03 ENCOUNTER — Other Ambulatory Visit: Payer: Self-pay

## 2020-08-03 VITALS — BP 137/86 | HR 85

## 2020-08-03 DIAGNOSIS — M5416 Radiculopathy, lumbar region: Secondary | ICD-10-CM

## 2020-08-03 MED ORDER — BETAMETHASONE SOD PHOS & ACET 6 (3-3) MG/ML IJ SUSP
12.0000 mg | Freq: Once | INTRAMUSCULAR | Status: AC
  Administered 2020-08-03: 12 mg

## 2020-08-03 NOTE — Progress Notes (Signed)
Pt state lower back pain that travels down his right groin down to his right knee and leg. Pt state bendingopver makes the pain worse. Pt state hot showers and icyhot helps ease the pain.   Numeric Pain Rating Scale and Functional Assessment Average Pain 3   In the last MONTH (on 0-10 scale) has pain interfered with the following?  1. General activity like being  able to carry out your everyday physical activities such as walking, climbing stairs, carrying groceries, or moving a chair?  Rating(3)   +Driver, -BT, -Dye Allergies.

## 2020-08-16 ENCOUNTER — Ambulatory Visit (INDEPENDENT_AMBULATORY_CARE_PROVIDER_SITE_OTHER): Payer: Medicare Other | Admitting: Orthopaedic Surgery

## 2020-08-16 ENCOUNTER — Encounter: Payer: Self-pay | Admitting: Orthopaedic Surgery

## 2020-08-16 ENCOUNTER — Other Ambulatory Visit: Payer: Self-pay

## 2020-08-16 DIAGNOSIS — G8929 Other chronic pain: Secondary | ICD-10-CM

## 2020-08-16 DIAGNOSIS — M5441 Lumbago with sciatica, right side: Secondary | ICD-10-CM | POA: Diagnosis not present

## 2020-08-16 NOTE — Progress Notes (Signed)
The patient reports good relief from a recent right-sided epidural steroid injection by Dr. Alvester Morin.  He is someone who has a very regimented and disciplined stretching routine.  He also does planks.  He is thin.  His previous symptoms were a burning radicular pain going down his right leg.  That has subsided quite a bit.  On my exam today he has excellent range of motion of his hip and knee on the right side with excellent strength in his right lower extremity.  He has a negative straight leg raise as well.  He will continue being careful about how he lifts things and performs repetitive types of work and activities.  He will continue his stretching routine and core strengthening.  Follow-up can be as needed.  All questions and concerns were answered and addressed.

## 2020-10-03 NOTE — Procedures (Signed)
Lumbar Epidural Steroid Injection - Interlaminar Approach with Fluoroscopic Guidance  Patient: Danny Bender      Date of Birth: 1956/06/14 MRN: [id number] PCP: [name], MD      Visit Date: 08/03/2020   Universal Protocol:     Consent Given By: the patient  Position: PRONE  Additional Comments: Vital signs were monitored before and after the procedure. Patient was prepped and draped in the usual sterile fashion. The correct patient, procedure, and site was verified.   Injection Procedure Details:   Procedure diagnoses: Lumbar radiculopathy [M54.16]   Meds Administered:  Meds ordered this encounter  Medications  . betamethasone acetate-betamethasone sodium phosphate (CELESTONE) injection 12 mg     Laterality: Right  Location/Site:  L4-L5  Needle: 3.5 in., 20 ga. Tuohy  Needle Placement: Paramedian epidural  Findings:   -Comments: Excellent flow of contrast into the epidural space.  Procedure Details: Using a paramedian approach from the side mentioned above, the region overlying the inferior lamina was localized under fluoroscopic visualization and the soft tissues overlying this structure were infiltrated with 4 ml. of 1% Lidocaine without Epinephrine. The Tuohy needle was inserted into the epidural space using a paramedian approach.   The epidural space was localized using loss of resistance along with counter oblique bi-planar fluoroscopic views.  After negative aspirate for air, blood, and CSF, a 2 ml. volume of Isovue-250 was injected into the epidural space and the flow of contrast was observed. Radiographs were obtained for documentation purposes.    The injectate was administered into the level noted above.   Additional Comments:  The patient tolerated the procedure well Dressing: 2 x 2 sterile gauze and Band-Aid    Post-procedure details: Patient was observed during the procedure. Post-procedure instructions were reviewed.  Patient left the  clinic in stable condition.

## 2020-10-03 NOTE — Progress Notes (Signed)
Danny Bender - 65 y.o. male MRN [id number]  Date of birth: August 08, 1956  Office Visit Note: Visit Date: 08/03/2020 PCP: [name], MD Referred by: [name], MD  Subjective: Chief Complaint  Patient presents with  . Right Knee - Pain  . Right Leg - Pain  . Lower Back - Pain   HPI:  Danny Bender is a 65 y.o. male who comes in today at the request of Dr. Jean Rosenthal for planned Right L4-L5 Lumbar epidural steroid injection with fluoroscopic guidance.  The patient has failed conservative care including home exercise, medications, time and activity modification.  This injection will be diagnostic and hopefully therapeutic.  Please see requesting physician notes for further details and justification.   MRI reviewed with images and spine model.  MRI reviewed in the note below.   ROS Otherwise per HPI.  Assessment & Plan: Visit Diagnoses:    ICD-10-CM   1. Lumbar radiculopathy  M54.16 XR C-ARM NO REPORT    Epidural Steroid injection    betamethasone acetate-betamethasone sodium phosphate (CELESTONE) injection 12 mg    Plan: No additional findings.   Meds & Orders:  Meds ordered this encounter  Medications  . betamethasone acetate-betamethasone sodium phosphate (CELESTONE) injection 12 mg    Orders Placed This Encounter  Procedures  . XR C-ARM NO REPORT  . Epidural Steroid injection    Follow-up: Return if symptoms worsen or fail to improve.   Procedures: No procedures performed  Lumbar Epidural Steroid Injection - Interlaminar Approach with Fluoroscopic Guidance  Patient: Danny Bender      Date of Birth: 1956/01/25 MRN: [id number] PCP: [name], MD      Visit Date: 08/03/2020   Universal Protocol:     Consent Given By: the patient  Position: PRONE  Additional Comments: Vital signs were monitored before and after the procedure. Patient was prepped and draped in the usual sterile fashion. The correct patient, procedure, and site  was verified.   Injection Procedure Details:   Procedure diagnoses: Lumbar radiculopathy [M54.16]   Meds Administered:  Meds ordered this encounter  Medications  . betamethasone acetate-betamethasone sodium phosphate (CELESTONE) injection 12 mg     Laterality: Right  Location/Site:  L4-L5  Needle: 3.5 in., 20 ga. Tuohy  Needle Placement: Paramedian epidural  Findings:   -Comments: Excellent flow of contrast into the epidural space.  Procedure Details: Using a paramedian approach from the side mentioned above, the region overlying the inferior lamina was localized under fluoroscopic visualization and the soft tissues overlying this structure were infiltrated with 4 ml. of 1% Lidocaine without Epinephrine. The Tuohy needle was inserted into the epidural space using a paramedian approach.   The epidural space was localized using loss of resistance along with counter oblique bi-planar fluoroscopic views.  After negative aspirate for air, blood, and CSF, a 2 ml. volume of Isovue-250 was injected into the epidural space and the flow of contrast was observed. Radiographs were obtained for documentation purposes.    The injectate was administered into the level noted above.   Additional Comments:  The patient tolerated the procedure well Dressing: 2 x 2 sterile gauze and Band-Aid    Post-procedure details: Patient was observed during the procedure. Post-procedure instructions were reviewed.  Patient left the clinic in stable condition.     Clinical History: MRI LUMBAR SPINE WITHOUT CONTRAST  TECHNIQUE: Multiplanar, multisequence MR imaging of the lumbar spine was performed. No intravenous contrast was administered.  COMPARISON:  Lumbar radiographs 05/24/2020  FINDINGS: Segmentation:  Normal  Alignment:  Mild retrolisthesis L1-2, L2-3.  Mild lumbar scoliosis.  Vertebrae: Negative for fracture or mass. Discogenic edema is present at multiple levels throughout  the lumbar spine related to disc degeneration.  Conus medullaris and cauda equina: Conus extends to the L1-2 level. Conus and cauda equina appear normal.  Paraspinal and other soft tissues: Negative for paraspinous mass or adenopathy  Disc levels:  T12-L1: Asymmetric disc degeneration on the left. No significant stenosis  L1-2: Moderate to advanced disc degeneration asymmetric to the left. Mild subarticular stenosis on the left due to spurring. Spinal canal adequate in size  L2-3: Moderate to advanced disc degeneration with diffuse endplate spurring and bilateral facet degeneration. Mild spinal stenosis. Mild to moderate subarticular stenosis bilaterally  L3-4: Asymmetric disc degeneration and spurring on the right. Right lateral osteophyte as noted on x-ray. Moderate subarticular and foraminal stenosis on the right due to spurring. Mild facet degeneration and mild spinal stenosis.  L4-5: Disc degeneration with disc bulging and diffuse endplate spurring. Bilateral facet degeneration. Moderate subarticular stenosis bilaterally left greater than right  L5-S1: Moderate disc degeneration with diffuse endplate spurring. Bilateral facet hypertrophy. Mild to moderate subarticular stenosis bilaterally.  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 Multilevel spinal and foraminal stenosis as above.   Electronically Signed   By: Franchot Gallo M.D.   On: 07/01/2020 15:03     Objective:  VS:  HT:    WT:   BMI:     BP:137/86  HR:85bpm  TEMP: ( )  RESP:  Physical Exam Vitals and nursing note reviewed.  Constitutional:      General: He is not in acute distress.    Appearance: Normal appearance. He is not ill-appearing.  HENT:     Head: Normocephalic and atraumatic.     Right Ear: External ear normal.     Left Ear: External ear normal.     Nose: No congestion.  Eyes:      Extraocular Movements: Extraocular movements intact.  Cardiovascular:     Rate and Rhythm: Normal rate.     Pulses: Normal pulses.  Pulmonary:     Effort: Pulmonary effort is normal. No respiratory distress.  Abdominal:     General: There is no distension.     Palpations: Abdomen is soft.  Musculoskeletal:        General: No tenderness or signs of injury.     Cervical back: Neck supple.     Right lower leg: No edema.     Left lower leg: No edema.     Comments: Patient has good distal strength without clonus.  Skin:    Findings: No erythema or rash.  Neurological:     General: No focal deficit present.     Mental Status: He is alert and oriented to person, place, and time.     Sensory: No sensory deficit.     Motor: No weakness or abnormal muscle tone.     Coordination: Coordination normal.  Psychiatric:        Mood and Affect: Mood normal.        Behavior: Behavior normal.      Imaging: No results found.

## 2020-10-11 ENCOUNTER — Ambulatory Visit: Payer: Self-pay | Admitting: Surgery

## 2020-10-11 NOTE — H&P (Signed)
Danny Bender Appointment: 10/11/2020 11:00 AM Location:  [facility] Patient #: [id number] DOB: 10/21/1955 Married / Language: English / Race: White Male   History of Present Illness Danny Hector MD; 10/11/2020 11:40 AM) The patient is a 65 year old male who presents with an inguinal hernia. Note for "Inguinal hernia": ` ` ` Patient sent for surgical consultation at the request of [facility] [address] [place], [place] [address] [phone]  Claris Pong, MD     Chief Complaint: Hernia in right groin ` ` The patient is a active male. Has some lumbar radiculopathy followed by Dr. [name] & Ernestina Patches. History of open left inguinal hernia, Dr. Harlow Asa, 2004. He recalls no major issue with that. Records not available. Hip replacement by Dr. [name] in 2018. Apparently was diagnosed with a inguinal hernia last year. Became more obvious. Concern for hernia. Patient requested surgical evaluation. Referred by his primary care physician, Dr. Daron Offer. Patient exercises at the gym about an hour a day. Primarily cardio work. He says some persistent swelling. Wanted to be proactive for the hernia became more bothersome. Claims to his bowels once a day. No problems and urination or defecation. He is not a smoker. No cardiac or pulmonary issues. Was his bowels once a day. He is not on any blood thinners. Not diabetic. He does not smoke. No sleep apnea.  (Review of systems as stated in this history (HPI) or in the review of systems. Otherwise all other 12 point ROS are negative) ` ` ###########################################`  This patient encounter took 30 minutes today to perform the following: obtain history, perform exam, review outside records, interpret tests & imaging, counsel the patient on their diagnosis; and, document this encounter, including findings & plan in the electronic health record (EHR).   Past  Surgical History Hortencia Conradi, CMA; 10/11/2020 11:03 AM) Open Inguinal Hernia Surgery  Left. Spinal Surgery - Neck   Allergies (Kheana Marshall-McBride, CMA; 10/11/2020 11:03 AM) predniSONE *CORTICOSTEROIDS*  Allergies Reconciled   Medication History (Kheana Marshall-McBride, CMA; 10/11/2020 11:04 AM) Fluticasone Propionate (50MCG/ACT Suspension, Nasal) Active. Medications Reconciled  Social History Hortencia Conradi, CMA; 10/11/2020 11:03 AM) Alcohol use  Moderate alcohol use. Caffeine use  Coffee. Tobacco use  Former smoker.  Family History Hortencia Conradi, CMA; 10/11/2020 11:03 AM) Arthritis  Father, Mother, Sister. Breast Cancer  Sister. Colon Polyps  Father. Hypertension  Father. Respiratory Condition  Mother.    Review of Systems (Kheana Marshall-McBride CMA; 10/11/2020 11:03 AM) HEENT Present- Wears glasses/contact lenses. Not Present- Earache, Hearing Loss, Hoarseness, Nose Bleed, Oral Ulcers, Ringing in the Ears, Seasonal Allergies, Sinus Pain, Sore Throat, Visual Disturbances and Yellow Eyes. Gastrointestinal Not Present- Abdominal Pain, Bloating, Bloody Stool, Change in Bowel Habits, Chronic diarrhea, Constipation, Difficulty Swallowing, Excessive gas, Gets full quickly at meals, Hemorrhoids, Indigestion, Nausea, Rectal Pain and Vomiting. Male Genitourinary Not Present- Blood in Urine, Change in Urinary Stream, Frequency, Impotence, Nocturia, Painful Urination, Urgency and Urine Leakage.  Vitals (Kheana Marshall-McBride CMA; 10/11/2020 11:04 AM) 10/11/2020 11:04 AM Weight: 153.25 lb Height: 67in Body Surface Area: 1.81 m Body Mass Index: 24 kg/m  Temp.: 97.27F  Pulse: 89 (Regular)  P.OX: 96% (Room air) BP: 162/88(Sitting, Left Arm, Standard)       Physical Exam Danny Hector MD; 10/11/2020 11:40 AM) General Mental Status-Alert. General Appearance-Not in acute distress, Not Sickly. Orientation-Oriented  X3. Hydration-Well hydrated. Voice-Normal. Note: Pleasant. Chatty   Integumentary Global Assessment Upon inspection and palpation of skin surfaces of the -  Axillae: non-tender, no inflammation or ulceration, no drainage. and Distribution of scalp and body hair is normal. General Characteristics Temperature - normal warmth is noted.  Head and Neck Head-normocephalic, atraumatic with no lesions or palpable masses. Face Global Assessment - atraumatic, no absence of expression. Neck Global Assessment - no abnormal movements, no bruit auscultated on the right, no bruit auscultated on the left, no decreased range of motion, non-tender. Trachea-midline. Thyroid Gland Characteristics - non-tender.  Eye Eyeball - Left-Extraocular movements intact, No Nystagmus - Left. Eyeball - Right-Extraocular movements intact, No Nystagmus - Right. Cornea - Left-No Hazy - Left. Cornea - Right-No Hazy - Right. Sclera/Conjunctiva - Left-No scleral icterus, No Discharge - Left. Sclera/Conjunctiva - Right-No scleral icterus, No Discharge - Right. Pupil - Left-Direct reaction to light normal. Pupil - Right-Direct reaction to light normal.  ENMT Ears Pinna - Left - no drainage observed, no generalized tenderness observed. Pinna - Right - no drainage observed, no generalized tenderness observed. Nose and Sinuses External Inspection of the Nose - no destructive lesion observed. Inspection of the nares - Left - quiet respiration. Inspection of the nares - Right - quiet respiration. Mouth and Throat Lips - Upper Lip - no fissures observed, no pallor noted. Lower Lip - no fissures observed, no pallor noted. Nasopharynx - no discharge present. Oral Cavity/Oropharynx - Tongue - no dryness observed. Oral Mucosa - no cyanosis observed. Hypopharynx - no evidence of airway distress observed.  Chest and Lung Exam Inspection Movements - Normal and Symmetrical. Accessory muscles - No use of  accessory muscles in breathing. Palpation Palpation of the chest reveals - Non-tender. Auscultation Breath sounds - Normal and Clear.  Cardiovascular Auscultation Rhythm - Regular. Murmurs & Other Heart Sounds - Auscultation of the heart reveals - No Murmurs and No Systolic Clicks.  Abdomen Inspection Inspection of the abdomen reveals - No Visible peristalsis and No Abnormal pulsations. Umbilicus - No Bleeding, No Urine drainage. Palpation/Percussion Palpation and Percussion of the abdomen reveal - Soft, Non Tender, No Rebound tenderness, No Rigidity (guarding) and No Cutaneous hyperesthesia. Note: Abdomen soft. Nontender. Not distended. No umbilical or incisional hernias. No guarding.   Male Genitourinary Sexual Maturity Tanner 5 - Adult hair pattern and Adult penile size and shape. Note: Obvious right groin bulging consistent with a small reducible inguinal hernia. On the left side no evidence of any recurrent left inguinal hernia with stable oblique incision.   Peripheral Vascular Upper Extremity Inspection - Left - No Cyanotic nailbeds - Left, Not Ischemic. Inspection - Right - No Cyanotic nailbeds - Right, Not Ischemic.  Neurologic Neurologic evaluation reveals -normal attention span and ability to concentrate, able to name objects and repeat phrases. Appropriate fund of knowledge , normal sensation and normal coordination. Mental Status Affect - not angry, not paranoid. Cranial Nerves-Normal Bilaterally. Gait-Normal.  Neuropsychiatric Mental status exam performed with findings of-able to articulate well with normal speech/language, rate, volume and coherence, thought content normal with ability to perform basic computations and apply abstract reasoning and no evidence of hallucinations, delusions, obsessions or homicidal/suicidal ideation.  Musculoskeletal Global Assessment Spine, Ribs and Pelvis - no instability, subluxation or laxity. Right Upper  Extremity - no instability, subluxation or laxity.  Lymphatic Head & Neck  General Head & Neck Lymphatics: Bilateral - Description - No Localized lymphadenopathy. Axillary  General Axillary Region: Bilateral - Description - No Localized lymphadenopathy. Femoral & Inguinal  Generalized Femoral & Inguinal Lymphatics: Left - Description - No Localized lymphadenopathy. Right - Description - No Localized lymphadenopathy.  Assessment & Plan Danny Hector MD; 10/11/2020 11:39 AM) RIGHT INGUINAL HERNIA (K40.90) Impression: Obvious right inguinal hernia. No evidence of left renal hernia recurrence.  I think he would benefit from hernia repair since his of decent size and he has good life expectancy. He is very active person so hopefully his risks of surgery not particularly high.  Had a long discussion with him and worked to address his questions and concerns. He is leaning towards surgery. He may want to get it done now versus waiting until the fall. He has not decided. I gave him information and asked to reach out when he is ready to consider surgery. PREOP - ING HERNIA - ENCOUNTER FOR PREOPERATIVE EXAMINATION FOR GENERAL SURGICAL PROCEDURE (Z01.818) Current Plans You are being scheduled for surgery- Our schedulers will call you.  You should hear from our office's scheduling department within 5 working days about the location, date, and time of surgery. We try to make accommodations for patient's preferences in scheduling surgery, but sometimes the OR schedule or the surgeon's schedule prevents [place] from making those accommodations.  If you have not heard from our office [phone]) in 5 working days, call the office and ask for your surgeon's nurse.  If you have other questions about your diagnosis, plan, or surgery, call the office and ask for your surgeon's nurse.  Written instructions provided The anatomy & physiology of the abdominal wall and pelvic floor was discussed. The  pathophysiology of hernias in the inguinal and pelvic region was discussed. Natural history risks such as progressive enlargement, pain, incarceration, and strangulation was discussed. Contributors to complications such as smoking, obesity, diabetes, prior surgery, etc were discussed.  I feel the risks of no intervention will lead to serious problems that outweigh the operative risks; therefore, I recommended surgery to reduce and repair the hernia. I explained laparoscopic techniques with possible need for an open approach. I noted usual use of mesh to patch and/or buttress hernia repair  Risks such as bleeding, infection, abscess, need for further treatment, heart attack, death, and other risks were discussed. I noted a good likelihood this will help address the problem. Goals of post-operative recovery were discussed as well. Possibility that this will not correct all symptoms was explained. I stressed the importance of low-impact activity, aggressive pain control, avoiding constipation, & not pushing through pain to minimize risk of post-operative chronic pain or injury. Possibility of reherniation was discussed. We will work to minimize complications.  An educational handout further explaining the pathology & treatment options was given as well. Questions were answered. The patient expresses understanding & wishes to proceed with surgery.  Pt Education - Pamphlet Given - Laparoscopic Hernia Repair: discussed with patient and provided information. Pt Education - [facility] Pain Control (Kemond Amorin) Pt Education - [facility] Hernia Post-Op HCI (Iza Preston): discussed with patient and provided information. Pt Education - [facility] Mesh education: discussed with patient and provided information.  Danny Hector, MD, FACS, MASCRS Gastrointestinal and Minimally Invasive Surgery  [facility] [address] [place], [place] [address] [phone] Fax [phone]  Main/Paging  CONTACT INFORMATION: Weekday (9AM-5PM) concerns: Call [facility] main office at [phone] Weeknight (5PM-9AM) or Weekend/Holiday concerns: Check [email or web address] for General Surgery [facility] coverage (Please, do not use SecureChat as it is not reliable communication to operating surgeons for immediate patient care)

## 2020-10-11 NOTE — H&P (View-Only) (Signed)
Danny Bender Appointment: 10/11/2020 11:00 AM Location: [facility] Patient #: [id number] DOB: 04/04/1956 Married / Language: English / Race: White Male   History of Present Illness Danny Hector MD; 10/11/2020 11:40 AM) The patient is a 65 year old male who presents with an inguinal hernia. Note for "Inguinal hernia": ` ` ` Patient sent for surgical consultation at the request of [facility] [address] [place], [place] [address] [phone]  Claris Pong, MD     Chief Complaint: Hernia in right groin ` ` The patient is a active male. Has some lumbar radiculopathy followed by Dr. [name] & Ernestina Patches. History of open left inguinal hernia, Dr. Harlow Asa, 2004. He recalls no major issue with that. Records not available. Hip replacement by Dr. [name] in 2018. Apparently was diagnosed with a inguinal hernia last year. Became more obvious. Concern for hernia. Patient requested surgical evaluation. Referred by his primary care physician, Dr. Daron Offer. Patient exercises at the gym about an hour a day. Primarily cardio work. He says some persistent swelling. Wanted to be proactive for the hernia became more bothersome. Claims to his bowels once a day. No problems and urination or defecation. He is not a smoker. No cardiac or pulmonary issues. Was his bowels once a day. He is not on any blood thinners. Not diabetic. He does not smoke. No sleep apnea.  (Review of systems as stated in this history (HPI) or in the review of systems. Otherwise all other 12 point ROS are negative) ` ` ###########################################`  This patient encounter took 30 minutes today to perform the following: obtain history, perform exam, review outside records, interpret tests & imaging, counsel the patient on their diagnosis; and, document this encounter, including findings & plan in the electronic health record (EHR).   Past  Surgical History Hortencia Conradi, CMA; 10/11/2020 11:03 AM) Open Inguinal Hernia Surgery  Left. Spinal Surgery - Neck   Allergies (Kheana Marshall-McBride, CMA; 10/11/2020 11:03 AM) predniSONE *CORTICOSTEROIDS*  Allergies Reconciled   Medication History (Kheana Marshall-McBride, CMA; 10/11/2020 11:04 AM) Fluticasone Propionate (50MCG/ACT Suspension, Nasal) Active. Medications Reconciled  Social History Hortencia Conradi, CMA; 10/11/2020 11:03 AM) Alcohol use  Moderate alcohol use. Caffeine use  Coffee. Tobacco use  Former smoker.  Family History Hortencia Conradi, CMA; 10/11/2020 11:03 AM) Arthritis  Father, Mother, Sister. Breast Cancer  Sister. Colon Polyps  Father. Hypertension  Father. Respiratory Condition  Mother.    Review of Systems (Kheana Marshall-McBride CMA; 10/11/2020 11:03 AM) HEENT Present- Wears glasses/contact lenses. Not Present- Earache, Hearing Loss, Hoarseness, Nose Bleed, Oral Ulcers, Ringing in the Ears, Seasonal Allergies, Sinus Pain, Sore Throat, Visual Disturbances and Yellow Eyes. Gastrointestinal Not Present- Abdominal Pain, Bloating, Bloody Stool, Change in Bowel Habits, Chronic diarrhea, Constipation, Difficulty Swallowing, Excessive gas, Gets full quickly at meals, Hemorrhoids, Indigestion, Nausea, Rectal Pain and Vomiting. Male Genitourinary Not Present- Blood in Urine, Change in Urinary Stream, Frequency, Impotence, Nocturia, Painful Urination, Urgency and Urine Leakage.  Vitals (Kheana Marshall-McBride CMA; 10/11/2020 11:04 AM) 10/11/2020 11:04 AM Weight: 153.25 lb Height: 67in Body Surface Area: 1.81 m Body Mass Index: 24 kg/m  Temp.: 97.29F  Pulse: 89 (Regular)  P.OX: 96% (Room air) BP: 162/88(Sitting, Left Arm, Standard)       Physical Exam Danny Hector MD; 10/11/2020 11:40 AM) General Mental Status-Alert. General Appearance-Not in acute distress, Not Sickly. Orientation-Oriented  X3. Hydration-Well hydrated. Voice-Normal. Note: Pleasant. Chatty   Integumentary Global Assessment Upon inspection and palpation of skin surfaces of the -  Axillae: non-tender, no inflammation or ulceration, no drainage. and Distribution of scalp and body hair is normal. General Characteristics Temperature - normal warmth is noted.  Head and Neck Head-normocephalic, atraumatic with no lesions or palpable masses. Face Global Assessment - atraumatic, no absence of expression. Neck Global Assessment - no abnormal movements, no bruit auscultated on the right, no bruit auscultated on the left, no decreased range of motion, non-tender. Trachea-midline. Thyroid Gland Characteristics - non-tender.  Eye Eyeball - Left-Extraocular movements intact, No Nystagmus - Left. Eyeball - Right-Extraocular movements intact, No Nystagmus - Right. Cornea - Left-No Hazy - Left. Cornea - Right-No Hazy - Right. Sclera/Conjunctiva - Left-No scleral icterus, No Discharge - Left. Sclera/Conjunctiva - Right-No scleral icterus, No Discharge - Right. Pupil - Left-Direct reaction to light normal. Pupil - Right-Direct reaction to light normal.  ENMT Ears Pinna - Left - no drainage observed, no generalized tenderness observed. Pinna - Right - no drainage observed, no generalized tenderness observed. Nose and Sinuses External Inspection of the Nose - no destructive lesion observed. Inspection of the nares - Left - quiet respiration. Inspection of the nares - Right - quiet respiration. Mouth and Throat Lips - Upper Lip - no fissures observed, no pallor noted. Lower Lip - no fissures observed, no pallor noted. Nasopharynx - no discharge present. Oral Cavity/Oropharynx - Tongue - no dryness observed. Oral Mucosa - no cyanosis observed. Hypopharynx - no evidence of airway distress observed.  Chest and Lung Exam Inspection Movements - Normal and Symmetrical. Accessory muscles - No use of  accessory muscles in breathing. Palpation Palpation of the chest reveals - Non-tender. Auscultation Breath sounds - Normal and Clear.  Cardiovascular Auscultation Rhythm - Regular. Murmurs & Other Heart Sounds - Auscultation of the heart reveals - No Murmurs and No Systolic Clicks.  Abdomen Inspection Inspection of the abdomen reveals - No Visible peristalsis and No Abnormal pulsations. Umbilicus - No Bleeding, No Urine drainage. Palpation/Percussion Palpation and Percussion of the abdomen reveal - Soft, Non Tender, No Rebound tenderness, No Rigidity (guarding) and No Cutaneous hyperesthesia. Note: Abdomen soft. Nontender. Not distended. No umbilical or incisional hernias. No guarding.   Male Genitourinary Sexual Maturity Tanner 5 - Adult hair pattern and Adult penile size and shape. Note: Obvious right groin bulging consistent with a small reducible inguinal hernia. On the left side no evidence of any recurrent left inguinal hernia with stable oblique incision.   Peripheral Vascular Upper Extremity Inspection - Left - No Cyanotic nailbeds - Left, Not Ischemic. Inspection - Right - No Cyanotic nailbeds - Right, Not Ischemic.  Neurologic Neurologic evaluation reveals -normal attention span and ability to concentrate, able to name objects and repeat phrases. Appropriate fund of knowledge , normal sensation and normal coordination. Mental Status Affect - not angry, not paranoid. Cranial Nerves-Normal Bilaterally. Gait-Normal.  Neuropsychiatric Mental status exam performed with findings of-able to articulate well with normal speech/language, rate, volume and coherence, thought content normal with ability to perform basic computations and apply abstract reasoning and no evidence of hallucinations, delusions, obsessions or homicidal/suicidal ideation.  Musculoskeletal Global Assessment Spine, Ribs and Pelvis - no instability, subluxation or laxity. Right Upper  Extremity - no instability, subluxation or laxity.  Lymphatic Head & Neck  General Head & Neck Lymphatics: Bilateral - Description - No Localized lymphadenopathy. Axillary  General Axillary Region: Bilateral - Description - No Localized lymphadenopathy. Femoral & Inguinal  Generalized Femoral & Inguinal Lymphatics: Left - Description - No Localized lymphadenopathy. Right - Description - No Localized lymphadenopathy.  Assessment & Plan Danny Hector MD; 10/11/2020 11:39 AM) RIGHT INGUINAL HERNIA (K40.90) Impression: Obvious right inguinal hernia. No evidence of left renal hernia recurrence.  I think he would benefit from hernia repair since his of decent size and he has good life expectancy. He is very active person so hopefully his risks of surgery not particularly high.  Had a long discussion with him and worked to address his questions and concerns. He is leaning towards surgery. He may want to get it done now versus waiting until the fall. He has not decided. I gave him information and asked to reach out when he is ready to consider surgery. PREOP - ING HERNIA - ENCOUNTER FOR PREOPERATIVE EXAMINATION FOR GENERAL SURGICAL PROCEDURE (Z01.818) Current Plans You are being scheduled for surgery- Our schedulers will call you.  You should hear from our office's scheduling department within 5 working days about the location, date, and time of surgery. We try to make accommodations for patient's preferences in scheduling surgery, but sometimes the OR schedule or the surgeon's schedule prevents [place] from making those accommodations.  If you have not heard from our office [phone]) in 5 working days, call the office and ask for your surgeon's nurse.  If you have other questions about your diagnosis, plan, or surgery, call the office and ask for your surgeon's nurse.  Written instructions provided The anatomy & physiology of the abdominal wall and pelvic floor was discussed. The  pathophysiology of hernias in the inguinal and pelvic region was discussed. Natural history risks such as progressive enlargement, pain, incarceration, and strangulation was discussed. Contributors to complications such as smoking, obesity, diabetes, prior surgery, etc were discussed.  I feel the risks of no intervention will lead to serious problems that outweigh the operative risks; therefore, I recommended surgery to reduce and repair the hernia. I explained laparoscopic techniques with possible need for an open approach. I noted usual use of mesh to patch and/or buttress hernia repair  Risks such as bleeding, infection, abscess, need for further treatment, heart attack, death, and other risks were discussed. I noted a good likelihood this will help address the problem. Goals of post-operative recovery were discussed as well. Possibility that this will not correct all symptoms was explained. I stressed the importance of low-impact activity, aggressive pain control, avoiding constipation, & not pushing through pain to minimize risk of post-operative chronic pain or injury. Possibility of reherniation was discussed. We will work to minimize complications.  An educational handout further explaining the pathology & treatment options was given as well. Questions were answered. The patient expresses understanding & wishes to proceed with surgery.  Pt Education - Pamphlet Given - Laparoscopic Hernia Repair: discussed with patient and provided information. Pt Education - [facility] Pain Control (Jarrah Seher) Pt Education - [facility] Hernia Post-Op HCI (Zeyna Mkrtchyan): discussed with patient and provided information. Pt Education - [facility] Mesh education: discussed with patient and provided information.  Danny Hector, MD, FACS, MASCRS Gastrointestinal and Minimally Invasive Surgery  [facility] [address] [place], [place] [address] [phone] Fax [phone]  Main/Paging  CONTACT INFORMATION: Weekday (9AM-5PM) concerns: Call [facility] main office at [phone] Weeknight (5PM-9AM) or Weekend/Holiday concerns: Check [email or web address] for General Surgery [facility] coverage (Please, do not use SecureChat as it is not reliable communication to operating surgeons for immediate patient care)

## 2020-10-27 NOTE — Progress Notes (Addendum)
PCP - Dr. Terrill Mohr  Cardiologist - no  PPM/ICD -  Device Orders -  Rep Notified -   Chest x-ray -  EKG -  Stress Test -  ECHO -  Cardiac Cath -   Sleep Study -  CPAP -   Fasting Blood Sugar -  Checks Blood Sugar _____ times a day  Blood Thinner Instructions: Aspirin Instructions:  ERAS Protcol - PRE-SURGERY Ensure or G2-   COVID TEST- 3-14  Activity-works out every day without SOB aerobics and resistance training Anesthesia review:   Patient denies shortness of breath, fever, cough and chest pain at PAT appointment   All instructions explained to the patient, with a verbal understanding of the material. Patient agrees to go over the instructions while at home for a better understanding. Patient also instructed to self quarantine after being tested for COVID-19. The opportunity to ask questions was provided.

## 2020-10-27 NOTE — Patient Instructions (Addendum)
DUE TO COVID-19 ONLY ONE VISITOR IS ALLOWED TO COME WITH YOU AND STAY IN THE WAITING ROOM ONLY DURING PRE OP AND PROCEDURE DAY OF SURGERY.   TWO VISITOR  MAY VISIT WITH YOU AFTER SURGERY IN YOUR PRIVATE ROOM DURING VISITING HOURS ONLY!  YOU NEED TO HAVE A COVID 19 TEST ON__3-14-22_____ @_______ , THIS TEST MUST BE DONE BEFORE SURGERY,  COVID TESTING SITE [address] [place] [address], IT IS ON THE RIGHT GOING OUT [address] APPROXIMATELY  2 MINUTES PAST [employer] ON THE RIGHT. ONCE YOUR COVID TEST IS COMPLETED,  PLEASE BEGIN THE QUARANTINE INSTRUCTIONS AS OUTLINED IN YOUR HANDOUT.                Danny Bender  10/27/2020   Your procedure is scheduled on: 11-04-20   Report to [facility] Main  Entrance   Report to admitting at      0530 AM     Call this number if you have problems the morning of surgery [phone]    Remember: NO SOLID FOOD AFTER MIDNIGHT THE NIGHT PRIOR TO SURGERY. NOTHING BY MOUTH EXCEPT CLEAR LIQUIDS UNTIL    0430 am .   PLEASE FINISH ENSURE DRINK PER SURGEON ORDER  WHICH NEEDS TO BE COMPLETED AT    0430 am then nothing by mouth .    CLEAR LIQUID DIET                                                                   Black Coffee and tea, regular and decaf                            Plain Jell-O any favor except red or purple                                            Fruit ices (not with fruit pulp)                                      Iced Popsicles                                    Carbonated beverages, regular and diet                                    Cranberry, grape and apple juices Sports drinks like Gatorade Lightly seasoned clear broth or consume(fat free) Sugar, honey syrup  _____________________________________________________________________   BRUSH YOUR TEETH MORNING OF SURGERY AND RINSE YOUR MOUTH OUT, NO CHEWING GUM CANDY OR MINTS.     Take these medicines the morning of surgery with A SIP OF  WATER: Flonase                                 You may  not have any metal on your body including hair pins and              piercings  Do not wear jewelry,  lotions, powders or perfumes, deodorant                    Men may shave face and neck.   Do not bring valuables to the hospital. [place].  Contacts, dentures or bridgework may not be worn into surgery. .     Patients discharged the day of surgery will not be allowed to drive home. IF YOU ARE HAVING SURGERY AND GOING HOME THE SAME DAY, YOU MUST HAVE AN ADULT TO DRIVE YOU HOME AND BE WITH YOU FOR 24 HOURS. YOU MAY GO HOME BY TAXI OR UBER OR ORTHERWISE, BUT AN ADULT MUST ACCOMPANY YOU HOME AND STAY WITH YOU FOR 24 HOURS.  Name and phone number of your driver:  Special Instructions: N/A              Please read over the following fact sheets you were given: _____________________________________________________________________             [facility] - Preparing for Surgery Before surgery, you can play an important role.  Because skin is not sterile, your skin needs to be as free of germs as possible.  You can reduce the number of germs on your skin by washing with CHG (chlorahexidine gluconate) soap before surgery.  CHG is an antiseptic cleaner which kills germs and bonds with the skin to continue killing germs even after washing. Please DO NOT use if you have an allergy to CHG or antibacterial soaps.  If your skin becomes reddened/irritated stop using the CHG and inform your nurse when you arrive at Short Stay. Do not shave (including legs and underarms) for at least 48 hours prior to the first CHG shower.  You may shave your face/neck. Please follow these instructions carefully:  1.  Shower with CHG Soap the night before surgery and the  morning of Surgery.  2.  If you choose to wash your hair, wash your hair first as usual with your  normal  shampoo.  3.  After you shampoo, rinse  your hair and body thoroughly to remove the  shampoo.                           4.  Use CHG as you would any other liquid soap.  You can apply chg directly  to the skin and wash                       Gently with a scrungie or clean washcloth.  5.  Apply the CHG Soap to your body ONLY FROM THE NECK DOWN.   Do not use on face/ open                           Wound or open sores. Avoid contact with eyes, ears mouth and genitals (private parts).                       Wash face,  Genitals (private parts) with your normal soap.             6.  Wash thoroughly,  paying special attention to the area where your surgery  will be performed.  7.  Thoroughly rinse your body with warm water from the neck down.  8.  DO NOT shower/wash with your normal soap after using and rinsing off  the CHG Soap.                9.  Pat yourself dry with a clean towel.            10.  Wear clean pajamas.            11.  Place clean sheets on your bed the night of your first shower and do not  sleep with pets. Day of Surgery : Do not apply any lotions/deodorants the morning of surgery.  Please wear clean clothes to the hospital/surgery center.  FAILURE TO FOLLOW THESE INSTRUCTIONS MAY RESULT IN THE CANCELLATION OF YOUR SURGERY PATIENT SIGNATURE_________________________________  NURSE SIGNATURE__________________________________  ________________________________________________________________________

## 2020-10-29 ENCOUNTER — Other Ambulatory Visit: Payer: Self-pay

## 2020-10-29 ENCOUNTER — Encounter (HOSPITAL_COMMUNITY)
Admission: RE | Admit: 2020-10-29 | Discharge: 2020-10-29 | Disposition: A | Discharge status: Home / Self Care | Payer: Medicare Other | Source: Ambulatory Visit | Attending: Surgery | Admitting: Surgery

## 2020-10-29 ENCOUNTER — Encounter (HOSPITAL_COMMUNITY): Payer: Self-pay

## 2020-10-29 DIAGNOSIS — Z01812 Encounter for preprocedural laboratory examination: Secondary | ICD-10-CM | POA: Diagnosis not present

## 2020-10-29 HISTORY — DX: Gout, unspecified: M10.9

## 2020-10-29 LAB — CBC
HCT: 43.5 % (ref 39.0–52.0)
Hemoglobin: 14.7 g/dL (ref 13.0–17.0)
MCH: 33.4 pg (ref 26.0–34.0)
MCHC: 33.8 g/dL (ref 30.0–36.0)
MCV: 98.9 fL (ref 80.0–100.0)
Platelets: 250 10*3/uL (ref 150–400)
RBC: 4.4 MIL/uL (ref 4.22–5.81)
RDW: 12 % (ref 11.5–15.5)
WBC: 7.8 10*3/uL (ref 4.0–10.5)
nRBC: 0 % (ref 0.0–0.2)

## 2020-11-01 ENCOUNTER — Other Ambulatory Visit (HOSPITAL_COMMUNITY)
Admission: RE | Admit: 2020-11-01 | Discharge: 2020-11-01 | Disposition: A | Discharge status: Home / Self Care | Payer: Medicare Other | Source: Ambulatory Visit | Attending: Surgery | Admitting: Surgery

## 2020-11-01 DIAGNOSIS — Z01812 Encounter for preprocedural laboratory examination: Secondary | ICD-10-CM | POA: Diagnosis present

## 2020-11-01 DIAGNOSIS — Z20822 Contact with and (suspected) exposure to covid-19: Secondary | ICD-10-CM | POA: Insufficient documentation

## 2020-11-02 LAB — SARS CORONAVIRUS 2 (TAT 6-24 HRS): SARS Coronavirus 2: NEGATIVE

## 2020-11-03 MED ORDER — BUPIVACAINE LIPOSOME 1.3 % IJ SUSP
20.0000 mL | Freq: Once | INTRAMUSCULAR | Status: DC
  Filled 2020-11-03: qty 20

## 2020-11-03 NOTE — Anesthesia Preprocedure Evaluation (Addendum)
Anesthesia Evaluation  Patient identified by MRN, date of birth, ID band Patient awake    Reviewed: NPO status , Patient's Chart, lab work & pertinent test results  History of Anesthesia Complications (+) Family history of anesthesia reaction  Airway Mallampati: II  TM Distance: >3 FB Neck ROM: Full    Dental  (+) Teeth Intact   Pulmonary neg pulmonary ROS, former smoker,    Pulmonary exam normal        Cardiovascular negative cardio ROS   Rhythm:Regular Rate:Normal     Neuro/Psych negative neurological ROS  negative psych ROS   GI/Hepatic Neg liver ROS, Bilateral inguinal hernia   Endo/Other  negative endocrine ROS  Renal/GU negative Renal ROS  negative genitourinary   Musculoskeletal  (+) Arthritis , Osteoarthritis,    Abdominal (+)  Abdomen: soft. Bowel sounds: normal.  Peds  Hematology negative hematology ROS (+)   Anesthesia Other Findings   Reproductive/Obstetrics                            Anesthesia Physical Anesthesia Plan  ASA: II  Anesthesia Plan: General   Post-op Pain Management:    Induction: Intravenous  PONV Risk Score and Plan: 2 and Ondansetron, Dexamethasone, Midazolam and Treatment may vary due to age or medical condition  Airway Management Planned: Mask and Oral ETT  Additional Equipment: None  Intra-op Plan:   Post-operative Plan: Extubation in OR  Informed Consent: I have reviewed the patients History and Physical, chart, labs and discussed the procedure including the risks, benefits and alternatives for the proposed anesthesia with the patient or authorized representative who has indicated his/her understanding and acceptance.     Dental advisory given  Plan Discussed with: CRNA  Anesthesia Plan Comments: (Lab Results      Component                Value               Date                      WBC                      7.8                  10/29/2020                HGB                      14.7                10/29/2020                HCT                      43.5                10/29/2020                MCV                      98.9                10/29/2020                PLT  250                 10/29/2020     )       Anesthesia Quick Evaluation

## 2020-11-04 ENCOUNTER — Encounter (HOSPITAL_COMMUNITY)
Admission: RE | Disposition: A | Discharge status: Home / Self Care | Payer: Self-pay | Source: Home / Self Care | Attending: Surgery

## 2020-11-04 ENCOUNTER — Ambulatory Visit (HOSPITAL_COMMUNITY): Payer: Medicare Other | Admitting: Certified Registered Nurse Anesthetist

## 2020-11-04 ENCOUNTER — Ambulatory Visit (HOSPITAL_COMMUNITY)
Admission: RE | Admit: 2020-11-04 | Discharge: 2020-11-04 | Disposition: A | Discharge status: Home / Self Care | Payer: Medicare Other | Attending: Surgery | Admitting: Surgery

## 2020-11-04 ENCOUNTER — Encounter (HOSPITAL_COMMUNITY): Payer: Self-pay | Admitting: Surgery

## 2020-11-04 DIAGNOSIS — Z96642 Presence of left artificial hip joint: Secondary | ICD-10-CM | POA: Insufficient documentation

## 2020-11-04 DIAGNOSIS — Z85828 Personal history of other malignant neoplasm of skin: Secondary | ICD-10-CM | POA: Insufficient documentation

## 2020-11-04 DIAGNOSIS — Z79899 Other long term (current) drug therapy: Secondary | ICD-10-CM | POA: Insufficient documentation

## 2020-11-04 DIAGNOSIS — K4030 Unilateral inguinal hernia, with obstruction, without gangrene, not specified as recurrent: Secondary | ICD-10-CM | POA: Diagnosis not present

## 2020-11-04 DIAGNOSIS — K4090 Unilateral inguinal hernia, without obstruction or gangrene, not specified as recurrent: Secondary | ICD-10-CM | POA: Diagnosis present

## 2020-11-04 DIAGNOSIS — Z87891 Personal history of nicotine dependence: Secondary | ICD-10-CM | POA: Insufficient documentation

## 2020-11-04 DIAGNOSIS — K4190 Unilateral femoral hernia, without obstruction or gangrene, not specified as recurrent: Secondary | ICD-10-CM | POA: Insufficient documentation

## 2020-11-04 HISTORY — PX: INGUINAL HERNIA REPAIR: SHX194

## 2020-11-04 SURGERY — REPAIR, HERNIA, INGUINAL, LAPAROSCOPIC
Anesthesia: General | Site: Abdomen | Laterality: Right

## 2020-11-04 MED ORDER — EPHEDRINE SULFATE-NACL 50-0.9 MG/10ML-% IV SOSY
PREFILLED_SYRINGE | INTRAVENOUS | Status: DC | PRN
  Administered 2020-11-04 (×2): 10 mg via INTRAVENOUS

## 2020-11-04 MED ORDER — PHENYLEPHRINE 40 MCG/ML (10ML) SYRINGE FOR IV PUSH (FOR BLOOD PRESSURE SUPPORT)
PREFILLED_SYRINGE | INTRAVENOUS | Status: AC
  Filled 2020-11-04: qty 20

## 2020-11-04 MED ORDER — ACETAMINOPHEN 500 MG PO TABS
1000.0000 mg | ORAL_TABLET | ORAL | Status: AC
  Administered 2020-11-04: 1000 mg via ORAL
  Filled 2020-11-04: qty 2

## 2020-11-04 MED ORDER — GLYCOPYRROLATE 0.2 MG/ML IJ SOLN
INTRAMUSCULAR | Status: DC | PRN
  Administered 2020-11-04: .2 mg via INTRAVENOUS

## 2020-11-04 MED ORDER — SUGAMMADEX SODIUM 200 MG/2ML IV SOLN
INTRAVENOUS | Status: DC | PRN
  Administered 2020-11-04: 200 mg via INTRAVENOUS

## 2020-11-04 MED ORDER — HYDROMORPHONE HCL 1 MG/ML IJ SOLN
0.2500 mg | INTRAMUSCULAR | Status: DC | PRN
Start: 2020-11-04 — End: 2020-11-04

## 2020-11-04 MED ORDER — DEXAMETHASONE SODIUM PHOSPHATE 10 MG/ML IJ SOLN
INTRAMUSCULAR | Status: DC | PRN
  Administered 2020-11-04: 6 mg via INTRAVENOUS

## 2020-11-04 MED ORDER — STERILE WATER FOR IRRIGATION IR SOLN
Status: DC | PRN
  Administered 2020-11-04: 1000 mL

## 2020-11-04 MED ORDER — ROCURONIUM BROMIDE 10 MG/ML (PF) SYRINGE
PREFILLED_SYRINGE | INTRAVENOUS | Status: AC
  Filled 2020-11-04: qty 10

## 2020-11-04 MED ORDER — ONDANSETRON HCL 4 MG/2ML IJ SOLN
INTRAMUSCULAR | Status: AC
  Filled 2020-11-04: qty 2

## 2020-11-04 MED ORDER — LIDOCAINE 2% (20 MG/ML) 5 ML SYRINGE
INTRAMUSCULAR | Status: DC | PRN
  Administered 2020-11-04: 60 mg via INTRAVENOUS

## 2020-11-04 MED ORDER — FENTANYL CITRATE (PF) 100 MCG/2ML IJ SOLN
INTRAMUSCULAR | Status: AC
  Filled 2020-11-04: qty 2

## 2020-11-04 MED ORDER — PROPOFOL 10 MG/ML IV BOLUS
INTRAVENOUS | Status: DC | PRN
  Administered 2020-11-04: 170 mg via INTRAVENOUS

## 2020-11-04 MED ORDER — PROPOFOL 10 MG/ML IV BOLUS
INTRAVENOUS | Status: AC
  Filled 2020-11-04: qty 40

## 2020-11-04 MED ORDER — OXYCODONE HCL 5 MG PO TABS
5.0000 mg | ORAL_TABLET | Freq: Once | ORAL | Status: DC | PRN
Start: 2020-11-04 — End: 2020-11-04

## 2020-11-04 MED ORDER — LACTATED RINGERS IV SOLN: INTRAVENOUS | Status: DC

## 2020-11-04 MED ORDER — DEXAMETHASONE SODIUM PHOSPHATE 10 MG/ML IJ SOLN
INTRAMUSCULAR | Status: AC
  Filled 2020-11-04: qty 1

## 2020-11-04 MED ORDER — KETAMINE HCL 10 MG/ML IJ SOLN
INTRAMUSCULAR | Status: DC | PRN
  Administered 2020-11-04: 25 mg via INTRAVENOUS

## 2020-11-04 MED ORDER — PROMETHAZINE HCL 25 MG/ML IJ SOLN
6.2500 mg | INTRAMUSCULAR | Status: DC | PRN
Start: 2020-11-04 — End: 2020-11-04

## 2020-11-04 MED ORDER — ROCURONIUM BROMIDE 10 MG/ML (PF) SYRINGE
PREFILLED_SYRINGE | INTRAVENOUS | Status: DC | PRN
  Administered 2020-11-04: 70 mg via INTRAVENOUS
  Administered 2020-11-04 (×2): 10 mg via INTRAVENOUS
  Administered 2020-11-04: 20 mg via INTRAVENOUS

## 2020-11-04 MED ORDER — GABAPENTIN 300 MG PO CAPS
300.0000 mg | ORAL_CAPSULE | ORAL | Status: AC
  Administered 2020-11-04: 300 mg via ORAL
  Filled 2020-11-04: qty 1

## 2020-11-04 MED ORDER — LACTATED RINGERS IR SOLN
Status: DC | PRN
  Administered 2020-11-04: 1000 mL

## 2020-11-04 MED ORDER — MIDAZOLAM HCL 2 MG/2ML IJ SOLN
INTRAMUSCULAR | Status: AC
  Filled 2020-11-04: qty 2

## 2020-11-04 MED ORDER — CHLORHEXIDINE GLUCONATE 0.12 % MT SOLN
15.0000 mL | Freq: Once | OROMUCOSAL | Status: AC
  Administered 2020-11-04: 15 mL via OROMUCOSAL

## 2020-11-04 MED ORDER — MIDAZOLAM HCL 5 MG/5ML IJ SOLN
INTRAMUSCULAR | Status: DC | PRN
  Administered 2020-11-04: 2 mg via INTRAVENOUS

## 2020-11-04 MED ORDER — BUPIVACAINE LIPOSOME 1.3 % IJ SUSP
INTRAMUSCULAR | Status: DC | PRN
  Administered 2020-11-04: 20 mL

## 2020-11-04 MED ORDER — OXYCODONE HCL 5 MG/5ML PO SOLN
5.0000 mg | Freq: Once | ORAL | Status: DC | PRN
Start: 2020-11-04 — End: 2020-11-04

## 2020-11-04 MED ORDER — BUPIVACAINE-EPINEPHRINE (PF) 0.25% -1:200000 IJ SOLN
INTRAMUSCULAR | Status: AC
  Filled 2020-11-04: qty 60

## 2020-11-04 MED ORDER — ENSURE PRE-SURGERY PO LIQD
296.0000 mL | Freq: Once | ORAL | Status: DC
  Filled 2020-11-04: qty 296

## 2020-11-04 MED ORDER — ONDANSETRON HCL 4 MG/2ML IJ SOLN
INTRAMUSCULAR | Status: DC | PRN
  Administered 2020-11-04: 4 mg via INTRAVENOUS

## 2020-11-04 MED ORDER — CHLORHEXIDINE GLUCONATE CLOTH 2 % EX PADS: 6.0000 | MEDICATED_PAD | Freq: Once | CUTANEOUS | Status: DC

## 2020-11-04 MED ORDER — CEFAZOLIN SODIUM-DEXTROSE 2-4 GM/100ML-% IV SOLN
2.0000 g | INTRAVENOUS | Status: AC
  Administered 2020-11-04: 2 g via INTRAVENOUS
  Filled 2020-11-04: qty 100

## 2020-11-04 MED ORDER — BUPIVACAINE-EPINEPHRINE 0.25% -1:200000 IJ SOLN
INTRAMUSCULAR | Status: DC | PRN
  Administered 2020-11-04: 60 mL

## 2020-11-04 MED ORDER — FENTANYL CITRATE (PF) 100 MCG/2ML IJ SOLN
INTRAMUSCULAR | Status: DC | PRN
  Administered 2020-11-04 (×2): 50 ug via INTRAVENOUS

## 2020-11-04 MED ORDER — ORAL CARE MOUTH RINSE: 15.0000 mL | Freq: Once | OROMUCOSAL | Status: AC

## 2020-11-04 MED ORDER — KETAMINE HCL 10 MG/ML IJ SOLN
INTRAMUSCULAR | Status: AC
  Filled 2020-11-04: qty 1

## 2020-11-04 MED ORDER — 0.9 % SODIUM CHLORIDE (POUR BTL) OPTIME
TOPICAL | Status: DC | PRN
  Administered 2020-11-04: 1000 mL

## 2020-11-04 SURGICAL SUPPLY — 36 items
CABLE HIGH FREQUENCY MONO STRZ (ELECTRODE) ×2 IMPLANT
CHLORAPREP W/TINT 26 (MISCELLANEOUS) ×2 IMPLANT
COVER SURGICAL LIGHT HANDLE (MISCELLANEOUS) ×2 IMPLANT
COVER WAND RF STERILE (DRAPES) IMPLANT
DECANTER SPIKE VIAL GLASS SM (MISCELLANEOUS) ×2 IMPLANT
DEVICE SECURE STRAP 25 ABSORB (INSTRUMENTS) IMPLANT
DRAPE WARM FLUID 44X44 (DRAPES) ×2 IMPLANT
DRSG TEGADERM 2-3/8X2-3/4 SM (GAUZE/BANDAGES/DRESSINGS) ×4 IMPLANT
DRSG TEGADERM 4X4.75 (GAUZE/BANDAGES/DRESSINGS) ×2 IMPLANT
ELECT REM PT RETURN 15FT ADLT (MISCELLANEOUS) ×2 IMPLANT
GAUZE SPONGE 2X2 8PLY STRL LF (GAUZE/BANDAGES/DRESSINGS) ×1 IMPLANT
GLOVE SURG LTX SZ8 (GLOVE) ×2 IMPLANT
GLOVE SURG UNDER LTX SZ8 (GLOVE) ×2 IMPLANT
GOWN STRL REUS W/TWL XL LVL3 (GOWN DISPOSABLE) ×4 IMPLANT
IRRIG SUCT STRYKERFLOW 2 WTIP (MISCELLANEOUS)
IRRIGATION SUCT STRKRFLW 2 WTP (MISCELLANEOUS) IMPLANT
KIT BASIN OR (CUSTOM PROCEDURE TRAY) ×2 IMPLANT
KIT TURNOVER KIT A (KITS) ×2 IMPLANT
MESH ULTRAPRO 6X6 15CM15CM (Mesh General) ×6 IMPLANT
NEEDLE INSUFFLATION 14GA 120MM (NEEDLE) IMPLANT
PAD POSITIONING PINK XL (MISCELLANEOUS) ×2 IMPLANT
SCISSORS LAP 5X35 DISP (ENDOMECHANICALS) ×2 IMPLANT
SET TUBE SMOKE EVAC HIGH FLOW (TUBING) ×2 IMPLANT
SLEEVE ADV FIXATION 5X100MM (TROCAR) ×2 IMPLANT
SPONGE GAUZE 2X2 STER 10/PKG (GAUZE/BANDAGES/DRESSINGS) ×1
SUT MNCRL AB 4-0 PS2 18 (SUTURE) ×2 IMPLANT
SUT PDS AB 1 CT1 27 (SUTURE) ×4 IMPLANT
SUT VIC AB 2-0 SH 27 (SUTURE)
SUT VIC AB 2-0 SH 27X BRD (SUTURE) IMPLANT
SUT VICRYL 0 UR6 27IN ABS (SUTURE) ×2 IMPLANT
TACKER 5MM HERNIA 3.5CML NAB (ENDOMECHANICALS) IMPLANT
TOWEL OR 17X26 10 PK STRL BLUE (TOWEL DISPOSABLE) ×2 IMPLANT
TOWEL OR NON WOVEN STRL DISP B (DISPOSABLE) ×2 IMPLANT
TRAY LAPAROSCOPIC (CUSTOM PROCEDURE TRAY) ×2 IMPLANT
TROCAR ADV FIXATION 5X100MM (TROCAR) ×2 IMPLANT
TROCAR XCEL BLUNT TIP 100MML (ENDOMECHANICALS) ×2 IMPLANT

## 2020-11-04 NOTE — Anesthesia Postprocedure Evaluation (Signed)
Anesthesia Post Note  Patient: Danny Bender  Procedure(s) Performed: LAPAROSCOPIC REPAIR RIGHT INGUINAL HERNIA WITH MESH, EXCISION OF PERITONEAL MASS, AND LEFT FEMORAL HERNIA  (Right Abdomen)     Patient location during evaluation: PACU Anesthesia Type: General Level of consciousness: awake and alert Pain management: pain level controlled Vital Signs Assessment: post-procedure vital signs reviewed and stable Respiratory status: spontaneous breathing, nonlabored ventilation, respiratory function stable and patient connected to nasal cannula oxygen Cardiovascular status: blood pressure returned to baseline and stable Postop Assessment: no apparent nausea or vomiting Anesthetic complications: no   No complications documented.  Last Vitals:  Vitals:   11/04/20 1030 11/04/20 1053  BP: 122/72 120/65  Pulse: (!) 50 61  Resp: (!) 8 10  Temp: (!) 36.4 C 36.7 C  SpO2: 99% 100%    Last Pain:  Vitals:   11/04/20 1053  TempSrc: Oral  PainSc:                  Danny Bender

## 2020-11-04 NOTE — Discharge Instructions (Signed)
HERNIA REPAIR: POST OP INSTRUCTIONS  ######################################################################  EAT Gradually transition to a high fiber diet with a fiber supplement over the next few weeks after discharge.  Start with a pureed / full liquid diet (see below)  WALK Walk an hour a day.  Control your pain to do that.    CONTROL PAIN Control pain so that you can walk, sleep, tolerate sneezing/coughing, and go up/down stairs.  HAVE A BOWEL MOVEMENT DAILY Keep your bowels regular to avoid problems.  OK to try a laxative to override constipation.  OK to use an antidairrheal to slow down diarrhea.  Call if not better after 2 tries  CALL IF YOU HAVE PROBLEMS/CONCERNS Call if you are still struggling despite following these instructions. Call if you have concerns not answered by these instructions  ######################################################################    1. DIET: Follow a light bland diet & liquids the first 24 hours after arrival home, such as soup, liquids, starches, etc.  Be sure to drink plenty of fluids.  Quickly advance to a usual solid diet within a few days.  Avoid fast food or heavy meals as your are more likely to get nauseated or have irregular bowels.  A low-fat, high-fiber diet for the rest of your life is ideal.   2. Take your usually prescribed home medications unless otherwise directed.  3. PAIN CONTROL: a. Pain is best controlled by a usual combination of three different methods TOGETHER: i. Ice/Heat ii. Over the counter pain medication iii. Prescription pain medication b. Most patients will experience some swelling and bruising around the hernia(s) such as the bellybutton, groins, or old incisions.  Ice packs or heating pads (30-60 minutes up to 6 times a day) will help. Use ice for the first few days to help decrease swelling and bruising, then switch to heat to help relax tight/sore spots and speed recovery.  Some people prefer to use ice  alone, heat alone, alternating between ice & heat.  Experiment to what works for you.  Swelling and bruising can take several weeks to resolve.   c. It is helpful to take an over-the-counter pain medication regularly for the first few weeks.  Choose one of the following that works best for you: i. Naproxen (Aleve, etc)  Two 244m tabs twice a day ii. Ibuprofen (Advil, etc) Three 2036mtabs four times a day (every meal & bedtime) iii. Acetaminophen (Tylenol, etc) 325-65016mour times a day (every meal & bedtime) d. A  prescription for pain medication should be given to you upon discharge.  Take your pain medication as prescribed.  i. If you are having problems/concerns with the prescription medicine (does not control pain, nausea, vomiting, rash, itching, etc), please call us [phone] see if we need to switch you to a different pain medicine that will work better for you and/or control your side effect better. ii. If you need a refill on your pain medication, please contact your pharmacy.  They will contact our office to request authorization. Prescriptions will not be filled after 5 pm or on week-ends.  4. Avoid getting constipated.  Between the surgery and the pain medications, it is common to experience some constipation.  Increasing fluid intake and taking a fiber supplement (such as Metamucil, Citrucel, FiberCon, MiraLax, etc) 1-2 times a day regularly will usually help prevent this problem from occurring.  A mild laxative (prune juice, Milk of Magnesia, MiraLax, etc) should be taken according to package directions if there are no bowel movements after 48  hours.    5. Wash / shower every day.  You may shower over the dressings as they are waterproof.    6. Remove your waterproof bandages, skin tapes, and other bandages 3 days after surgery. You may replace a dressing/Band-Aid to cover the incision for comfort if you wish. You may leave the incisions open to air.  You may replace a  dressing/Band-Aid to cover an incision for comfort if you wish.  Continue to shower over incision(s) after the dressing is off.  7. ACTIVITIES as tolerated:   a. You may resume regular (light) daily activities beginning the next day--such as daily self-care, walking, climbing stairs--gradually increasing activities as tolerated.  Control your pain so that you can walk an hour a day.  If you can walk 30 minutes without difficulty, it is safe to try more intense activity such as jogging, treadmill, bicycling, low-impact aerobics, swimming, etc. b. Save the most intensive and strenuous activity for last such as sit-ups, heavy lifting, contact sports, etc  Refrain from any heavy lifting or straining until you are off narcotics for pain control.   c. DO NOT PUSH THROUGH PAIN.  Let pain be your guide: If it hurts to do something, don't do it.  Pain is your body warning you to avoid that activity for another week until the pain goes down. d. You may drive when you are no longer taking prescription pain medication, you can comfortably wear a seatbelt, and you can safely maneuver your car and apply brakes. e. Dennis Bast may have sexual intercourse when it is comfortable.   8. FOLLOW UP in our office a. Please call [facility] at [phone] to set up an appointment to see your surgeon in the office for a follow-up appointment approximately 2-3 weeks after your surgery. b. Make sure that you call for this appointment the day you arrive home to insure a convenient appointment time.  9.  If you have disability of FMLA / Family leave forms, please bring the forms to the office for processing.  (do not give to your surgeon).  WHEN TO CALL us [phone]: 1. Poor pain control 2. Reactions / problems with new medications (rash/itching, nausea, etc)  3. Fever over 101.5 F (38.5 C) 4. Inability to urinate 5. Nausea and/or vomiting 6. Worsening swelling or bruising 7. Continued bleeding from incision. 8. Increased pain,  redness, or drainage from the incision   The clinic staff is available to answer your questions during regular business hours (8:30am-5pm).  Please don't hesitate to call and ask to speak to one of our nurses for clinical concerns.   If you have a medical emergency, go to the nearest emergency room or call 911.  A surgeon from [facility] is always on call at the hospitals in [facility], [place], [place], [place], [place]  [address] ?  [address] [address], [place], [place]   [address] MAIN: [phone] ? TOLL FREE: [phone] ? FAX: [phone] [email or web address]    Inguinal Hernia, Adult An inguinal hernia develops when fat or the intestines push through a weak spot in a muscle where the leg meets the lower abdomen (groin). This creates a bulge. This kind of hernia could also be:  In the scrotum, if you are male.  In folds of skin around the vagina, if you are male. There are three types of inguinal hernias:  Hernias that can be pushed back into the abdomen (are reducible). This type  rarely causes pain.  Hernias that are not reducible (are incarcerated).  Hernias that are not reducible and lose their blood supply (are strangulated). This type of hernia requires emergency surgery. What are the causes? This condition is caused by having a weak spot in the muscles or tissues in your groin. This develops over time. The hernia may poke through the weak spot when you suddenly strain your lower abdominal muscles, such as when you:  Lift a heavy object.  Strain to have a bowel movement. Constipation can lead to straining.  Cough. What increases the risk? This condition is more likely to develop in:  Males.  Pregnant females.  People who: ? Are overweight. ? Work in jobs that require long periods of standing or heavy lifting. ? Have had an inguinal hernia before. ? Smoke or have lung disease. These factors can  lead to long-term (chronic) coughing. What are the signs or symptoms? Symptoms may depend on the size of the hernia. Often, a small inguinal hernia has no symptoms. Symptoms of a larger hernia may include:  A bulge in the groin area. This is easier to see when standing. It might not be visible when lying down.  Pain or burning in the groin. This may get worse when lifting, straining, or coughing.  A dull ache or a feeling of pressure in the groin.  An unusual bulge in the scrotum, in males. Symptoms of a strangulated inguinal hernia may include:  A bulge in your groin that is very painful and tender to the touch.  A bulge that turns red or purple.  Fever, nausea, and vomiting.  Inability to have a bowel movement or to pass gas. How is this diagnosed? This condition is diagnosed based on your symptoms, your medical history, and a physical exam. Your health care provider may feel your groin area and ask you to cough. How is this treated? Treatment depends on the size of your hernia and whether you have symptoms. If you do not have symptoms, your health care provider may have you watch your hernia carefully and have you come in for follow-up visits. If your hernia is large or if you have symptoms, you may need surgery to repair the hernia.

## 2020-11-04 NOTE — Anesthesia Procedure Notes (Signed)
Procedure Name: Intubation Date/Time: 11/04/2020 7:37 AM Performed by: West Pugh, CRNA Pre-anesthesia Checklist: Patient identified, Emergency Drugs available, Suction available, Patient being monitored and Timeout performed Patient Re-evaluated:Patient Re-evaluated prior to induction Oxygen Delivery Method: Circle system utilized Preoxygenation: Pre-oxygenation with 100% oxygen Induction Type: IV induction Ventilation: Mask ventilation without difficulty Laryngoscope Size: 3 and Glidescope Grade View: Grade I Tube type: Oral Tube size: 7.5 mm Number of attempts: 1 Airway Equipment and Method: Stylet Placement Confirmation: ETT inserted through vocal cords under direct vision,  positive ETCO2,  CO2 detector and breath sounds checked- equal and bilateral Secured at: 22 cm Tube secured with: Tape Dental Injury: Teeth and Oropharynx as per pre-operative assessment  Comments: AOI x 1 attempt. Head, neck and spine maintained in neutral alignment during procedure.

## 2020-11-04 NOTE — Transfer of Care (Signed)
Immediate Anesthesia Transfer of Care Note  Patient: Danny Bender  Procedure(s) Performed: LAPAROSCOPIC REPAIR RIGHT INGUINAL HERNIA WITH MESH, EXCISION OF PERITONEAL MASS, AND LEFT FEMORAL HERNIA  (Right Abdomen)  Patient Location: PACU  Anesthesia Type:General  Level of Consciousness: awake, drowsy and patient cooperative  Airway & Oxygen Therapy: Patient Spontanous Breathing and Patient connected to face mask oxygen  Post-op Assessment: Report given to RN  Post vital signs: Reviewed and stable  Last Vitals:  Vitals Value Taken Time  BP  11/04/20 0952  Temp 36.5 C 11/04/20 0952  Pulse 60 11/04/20 0953  Resp 13 11/04/20 0953  SpO2 100 % 11/04/20 0953  Vitals shown include unvalidated device data.  Last Pain:  Vitals:   11/04/20 0606  TempSrc: Oral  PainSc:          Complications: No complications documented.

## 2020-11-04 NOTE — Interval H&P Note (Signed)
History and Physical Interval Note:  11/04/2020 7:10 AM  Danny Bender  has presented today for surgery, with the diagnosis of RIGHT AND POSSIBLE LEFT INGUINAL HERNIA.  The various methods of treatment have been discussed with the patient and family. After consideration of risks, benefits and other options for treatment, the patient has consented to  Procedure(s) with comments: LAPAROSCOPIC RIGHT AND POSSIBLE LEFT INGUINAL HERNIA REPAIR WITH MESH (N/A) - ROOM 4 STARTING AT 07:30AM FOR 60 MIN as a surgical intervention.  The patient's history has been reviewed, patient examined, no change in status, stable for surgery.  I have reviewed the patient's chart and labs.  Questions were answered to the patient's satisfaction.    I have re-reviewed the the patient's records, history, medications, and allergies.  I have re-examined the patient.  I again discussed intraoperative plans and goals of post-operative recovery.  The patient agrees to proceed.  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    Cellulitis 08/20/2017   Status post total replacement of left hip 08/10/2017   Chronic left hip pain 04/24/2017   Unilateral primary osteoarthritis, left hip 04/24/2017    Past Medical History:  Diagnosis Date   Arthritis    "shoulders, hands, left wrist" (08/22/2017)   Basal cell carcinoma    "used nitrogen chin, ?right" (08/22/2017)   Chronic back pain    Chronic lower back pain    Complication of anesthesia    took days to clear head   DDD (degenerative disc disease), cervical    DDD (degenerative disc disease), lumbar    Discoloration of skin    fingers and toe    Family history of adverse reaction to anesthesia    "sister had a problem;  don't remember what it was"   Gout    History of shingles    Nasal polyps    OA (osteoarthritis) of hip    Ruptured disk    lumbar    Past Surgical History:  Procedure Laterality Date   ABDOMINAL HERNIA REPAIR  2004   ANTERIOR CERVICAL DECOMP/DISCECTOMY FUSION  1991   HERNIA REPAIR     JOINT REPLACEMENT     TOTAL HIP ARTHROPLASTY Left 08/10/2017   Procedure: LEFT TOTAL HIP ARTHROPLASTY ANTERIOR APPROACH;  Surgeon: Mcarthur Rossetti, MD;  Location: [facility];  Service: Orthopedics;  Laterality: Left;    Social History   Socioeconomic History   Marital status: Single    Spouse name: Not on file   Number of children: Not on file   Years of education: Not on file   Highest education level: Not on file  Occupational History   Not on file  Tobacco Use   Smoking status: Former Smoker    Packs/day: 2.00    Years: 10.00    Pack years: 20.00    Types: Cigarettes    Quit date: 08/21/2002    Years since quitting: 18.2   Smokeless tobacco: Never Used  Scientific laboratory technician Use: Never used  Substance and Sexual Activity   Alcohol use: Not Currently    Alcohol/week: 7.0 standard drinks    Types: 7 Standard drinks or equivalent per week   Drug use: Yes    Types: Marijuana  Comment: 08/22/2017 "just started recently; to help w/pain"   Sexual activity: Not Currently  Other Topics Concern   Not on file  Social History Narrative   Not on file   Social Determinants of Health   Financial Resource Strain: Not on file  Food Insecurity: Not on file  Transportation Needs: Not on file  Physical Activity: Not on file  Stress: Not on file  Social Connections: Not on file  Intimate Partner Violence: Not on file    History reviewed. No pertinent family history.  Medications Prior to Admission  Medication Sig Dispense Refill Last Dose   ascorbic acid (VITAMIN C) 500 MG tablet Take 500 mg by mouth daily.   11/03/2020 at Unknown time   Calcium Carb-Cholecalciferol (CALCIUM 600+D3 PO)  Take 1 tablet by mouth in the morning and at bedtime.   11/03/2020 at Unknown time   fluticasone (FLONASE) 50 MCG/ACT nasal spray Place 1 spray into both nostrils daily.   11/03/2020 at Unknown time   GARLIC PO Take 1 capsule by mouth daily.   11/03/2020 at Unknown time   GLUCOSAMINE SULFATE PO Take 1 tablet by mouth in the morning and at bedtime.   11/03/2020 at Unknown time   Magnesium Gluconate 550 MG TABS Take 550 mg by mouth daily.   11/03/2020 at Unknown time   milk thistle 175 MG tablet Take 175 mg by mouth in the morning and at bedtime.   11/03/2020 at Unknown time   pyridoxine (B-6) 100 MG tablet Take 50 mg by mouth daily.   11/03/2020 at Unknown time   Tart Cherry (TART CHERRY ULTRA) 1200 MG CAPS Take 1,200 mg by mouth daily.   11/03/2020 at Unknown time   TURMERIC PO Take 1 capsule by mouth daily.   11/03/2020 at Unknown time   vitamin B-12 (CYANOCOBALAMIN) 1000 MCG tablet Take 1,000 mcg by mouth daily.   11/03/2020 at Unknown time   zinc gluconate 50 MG tablet Take 25 mg by mouth daily.   11/03/2020 at Unknown time    Current Facility-Administered Medications  Medication Dose Route Frequency Provider Last Rate Last Admin   bupivacaine liposome (EXPAREL) 1.3 % injection 266 mg  20 mL Infiltration Once Theodis Shove M, RPH       ceFAZolin (ANCEF) IVPB 2g/100 mL premix  2 g Intravenous On Call to OR Michael Boston, MD       Chlorhexidine Gluconate Cloth 2 % PADS 6 each  6 each Topical Once Michael Boston, MD       Derrill Memo ON 11/05/2020] feeding supplement (ENSURE PRE-SURGERY) liquid 296 mL  296 mL Oral Once Michael Boston, MD       lactated ringers infusion   Intravenous Continuous Oleta Mouse, MD 10 mL/hr at 11/04/20 0701 Continued from Pre-op at 11/04/20 0701     Allergies  Allergen Reactions   Prednisone     Other reaction(s): Dizziness (intolerance)    BP 134/74   Pulse (!) 56   Temp 98.1 F (36.7 C) (Oral)   Resp 18   Ht 5\' 7"  (1.702 m)   Wt 66.2 kg   SpO2 100%   BMI 22.87  kg/m   Labs: No results found for this or any previous visit (from the past 48 hour(s)).  Imaging / Studies: No results found.   [name], M.D., F.A.C.S. Gastrointestinal and Minimally Invasive Surgery [facility] [address], [place], [place] [address] [phone] Main / Paging  11/04/2020 7:10 AM  [name]

## 2020-11-04 NOTE — Op Note (Signed)
11/04/2020  9:51 AM  PATIENT:  Danny Bender  65 y.o. male  Patient Care Team: Nickola Major, MD as PCP - General (Family Medicine) Michael Boston, MD as Consulting Physician (General Surgery)  PRE-OPERATIVE DIAGNOSIS:  RIGHT AND POSSIBLE LEFT INGUINAL HERNIA  POST-OPERATIVE DIAGNOSIS: INCARCERATED RIGHT INGUINAL HERNIA PERITONEAL CYSTIC MASS IN HERNIA [place] LEFT FEMORAL HERNIA  PROCEDURE:  LAPAROSCOPIC REPAIR RIGHT INGUINAL HERNIA WITH MESH LAPAROSCOPIC LEFT FEMORAL HERNIA REPAIR WITH MESH EXCISION OF PERITONEAL MASS  SURGEON:  [name], MD  ASSISTANT: None  ANESTHESIA:     Regional ilioinguinal and genitofemoral and spermatic cord nerve blocks  General  Nerve block provided with liposomal bupivacaine (Experel) mixed with 0.25% bupivacaine as a Bilateral TAP block x 28mL each side at the level of the transverse abdominis & preperitoneal spaces along the flank at the anterior axillary line, from subcostal ridge to iliac crest under laparoscopic guidance    EBL:  Total I/O In: 100 [IV Piggyback:100] Out: - .  See anesthesia record  Delay start of Pharmacological VTE agent (>24hrs) due to surgical blood loss or risk of bleeding:  no  DRAINS: NONE  SPECIMEN:  Right inguinal hernia sac with peritoneal cyst  DISPOSITION OF SPECIMEN:  PATHOLOGY  COUNTS:  YES  PLAN OF CARE: Discharge to home after PACU  PATIENT DISPOSITION:  PACU - hemodynamically stable.  INDICATION: Active gentleman with right groin swelling and probable hernia.  History of prior hernia repair on the left side.  I offered laparoscopic exploration and repair of hernias found  The anatomy & physiology of the abdominal wall and pelvic floor was discussed.  The pathophysiology of hernias in the inguinal and pelvic region was discussed.  Natural history risks such as progressive enlargement, pain, incarceration & strangulation was discussed.   Contributors to complications such as smoking,  obesity, diabetes, prior surgery, etc were discussed.    I feel the risks of no intervention will lead to serious problems that outweigh the operative risks; therefore, I recommended surgery to reduce and repair the hernia.  I explained laparoscopic techniques with possible need for an open approach.  I noted usual use of mesh to patch and/or buttress hernia repair  Risks such as bleeding, infection, abscess, need for further treatment, heart attack, death, and other risks were discussed.  I noted a good likelihood this will help address the problem.   Goals of post-operative recovery were discussed as well.  Possibility that this will not correct all symptoms was explained.  I stressed the importance of low-impact activity, aggressive pain control, avoiding constipation, & not pushing through pain to minimize risk of post-operative chronic pain or injury. Possibility of reherniation was discussed.  We will work to minimize complications.     An educational handout further explaining the pathology & treatment options was given as well.  Questions were answered.  The patient expresses understanding & wishes to proceed with surgery.  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  No direct space inguinal nor femoral hernia.  Small right obturator hernia.  On left side no evidence of any recurrent direct or indirect inguinal hernia but evidence of developing femoral hernia.  No obturator hernia.  DESCRIPTION:  The patient was identified & brought into the operating room. The patient was positioned supine with arms tucked. SCDs were active during the entire case. The patient underwent general anesthesia without any difficulty.  The abdomen was prepped and draped in a sterile fashion. The patient's bladder was emptied.  A Surgical Timeout confirmed our plan.  I made a transverse incision through  the inferior umbilical fold.  I made a small transverse nick through the anterior rectus fascia contralateral to the inguinal hernia side and placed a 0-vicryl stitch through the fascia.  I placed a Hasson trocar into the preperitoneal plane.  Entry was clean.  We induced carbon dioxide insufflation. Camera inspection revealed no injury.  I used a 36mm angled scope to bluntly free the peritoneum off the infraumbilical anterior abdominal wall.  I created enough of a preperitoneal pocket to place 64mm ports into the right & left mid-abdomen into this preperitoneal cavity.  I focused attention on the RIGHT pelvis since that was the dominant hernia side.   I used blunt & focused sharp dissection to free the peritoneum off the flank and down to the pubic rim.  I freed the anteriolateral bladder wall off the anteriolateral pelvic wall, sparing midline attachments.   I located a swath of peritoneum going into a hernia fascial defect at the  internal ring consistent with  an indirect inguinal hernia..  I gradually freed the peritoneal hernia sac off safely and reduced it into the preperitoneal space.  Patient clearly had a ellipsoid cystic mass densely adherent in the region.  We prepped and draped the genitalia and scrotum in the field and confirmed that the right testicle was still in the scrotum and this was not the testes brought up.  The vas and spermatic vessels were going into the inguinal canal.  I carefully worked to try and free off of the cystic mass off the hernia sac.  It was rather thickened.  I opened up the peritoneum and hernia sac to see small bowel going up into this cystic mass.  I was concerned about a possible incarcerated Meckel's.  I carefully opened up the peritoneal hernia sac to release a knuckle of small bowel incarcerated within this chronic serous cystic-like mass.  Suspect it was hernia sac with a Richter's type partial ileal wall incarceration within it.  There was no evidence of any  ischemia nor perforation.  Carefully freed the cystic mass and peritoneum off of the ileum.  Carefully inspected to confirm no serosal injury or any other abnormality.  No evidence of any obstruction.  Therefore I do not feel a small bowel resection was needed.  It seemed more like an inflamed serous cystic mass and not a true cancer.  I did decide to excise a cystic mass in the peritoneum off the hernia sac and sent that for pathology just in case.  Debrided hernia sac and peritoneum off and closed the peritoneal defect using 2-0 Vicryl using laparoscopic intracorporeal suturing.  I checked & assured hemostasis.      I turned attention on the opposite  LEFT pelvis.  I did dissection in a similar, mirror-image fashion.  There was no evidence of any recurrent direct space or indirect inguinal hernias.  However there was a femoral hernia with some profile going up into it.  The patient had a femoral hernia.Marland Kitchen   Spermatic cord lipoma was dissected away & removed.    I checked & assured hemostasis.     I chose 15x15 cm sheets of ultra-lightweight polypropylene mesh (Ultrapro), one for each side.  I cut a single sigmoid-shaped slit ~6cm from a corner of each mesh.  I placed the meshes into the preperitoneal space &  laid them as overlapping diamonds such that at the inferior points, a 6x6 cm corner flap rested in the true anterolateral pelvis, covering the obturator & femoral foramina.   I allowed the bladder to return to the pubis, this helping tuck the corners of the mesh in the anteriolateral pelvis.  The medial corners overlapped each other across midline cephalad to the pubic rim.   Given the numerous hernias of moderate size, I placed a third 15x15cm mesh in the center as a vertical diamond.  The lateral wings of the mesh overlap across the direct spaces and internal rings where the dominant hernias were.  This provided good coverage and reinforcement of the hernia repairs.  Because of the central mesh placement  with good overlap, I did not place any tacks.   I held the hernia sacs cephalad & evacuated carbon dioxide.  I closed the fascia with absorbable suture.  I closed the skin using 4-0 monocryl stitch.  Sterile dressings were applied.   The patient was extubated & arrived in the PACU in stable condition..  I had discussed postoperative care with the patient in the holding area.  Instructions are written in the chart.  I tried to reach his neighbor who is apparently giving him a ride but I got no answer.  Patient is awake alert in recovery room.  We will have the patient follow [place] closely.   [name], M.D., F.A.C.S. Gastrointestinal and Minimally Invasive Surgery [facility] [address] [place], [place] [address] [phone] Main / Paging  11/04/2020 9:51 AM

## 2020-11-05 LAB — SURGICAL PATHOLOGY

## 2020-11-09 ENCOUNTER — Encounter (HOSPITAL_COMMUNITY): Payer: Self-pay | Admitting: Surgery

## 2020-11-12 ENCOUNTER — Encounter (HOSPITAL_COMMUNITY): Payer: Self-pay | Admitting: Surgery

## 2021-04-20 ENCOUNTER — Other Ambulatory Visit: Payer: Self-pay | Admitting: Family Medicine

## 2021-04-20 DIAGNOSIS — Z87891 Personal history of nicotine dependence: Secondary | ICD-10-CM

## 2021-04-20 DIAGNOSIS — I739 Peripheral vascular disease, unspecified: Secondary | ICD-10-CM

## 2021-05-16 ENCOUNTER — Ambulatory Visit
Admission: RE | Admit: 2021-05-16 | Discharge: 2021-05-16 | Disposition: A | Discharge status: Home / Self Care | Payer: Medicare Other | Source: Ambulatory Visit | Attending: Family Medicine | Admitting: Family Medicine

## 2021-05-16 DIAGNOSIS — I739 Peripheral vascular disease, unspecified: Secondary | ICD-10-CM

## 2021-05-16 DIAGNOSIS — Z87891 Personal history of nicotine dependence: Secondary | ICD-10-CM

## 2021-05-16 IMAGING — US US ABDOMINAL AORTA SCREENING AAA
1 series · 14 of 14 positions shown · non-contrast
Comparison: None.

CLINICAL DATA: Male between 65-75 years of age with a smoking
history.

EXAM:
US ABDOMINAL AORTA MEDICARE SCREENING
TECHNIQUE: Ultrasound examination of the abdominal aorta was performed as a
screening evaluation for abdominal aortic aneurysm.

[Series 1: us abdominal aorta screening aaa · 0.23mm/px · 14 of 14 slices shown]
[im 1/14]
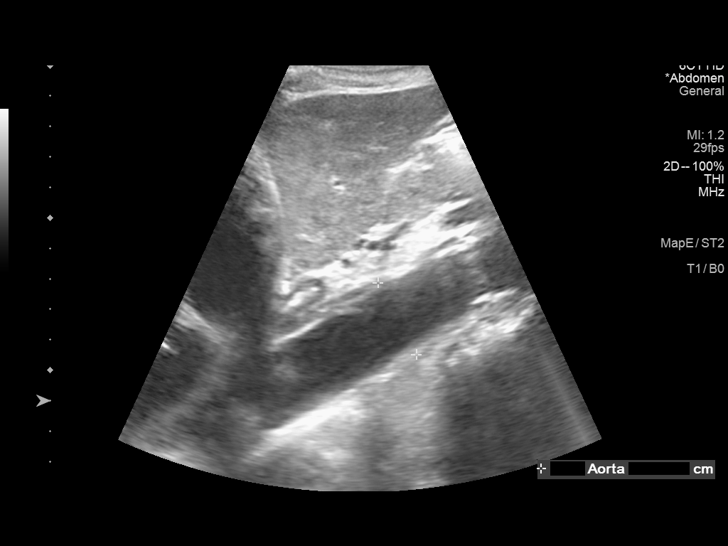
[im 2/14]
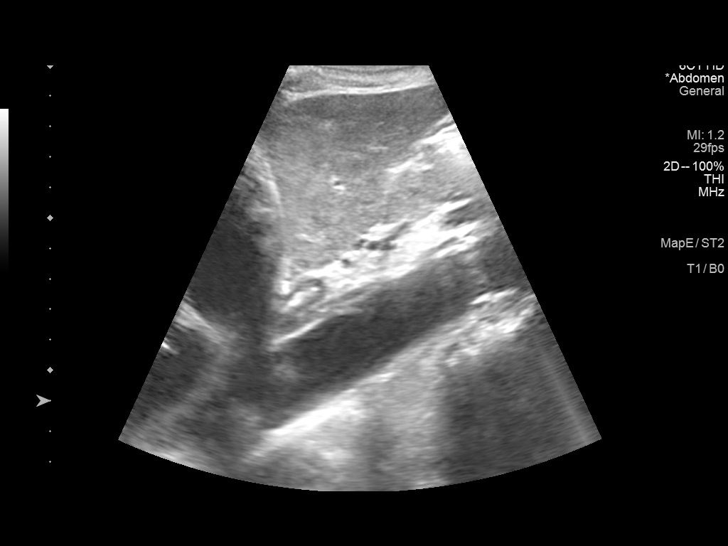
[im 3/14]
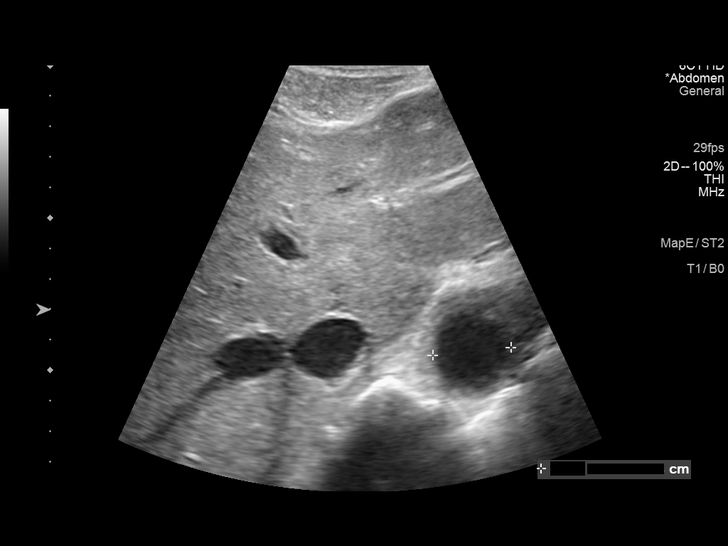
[im 4/14]
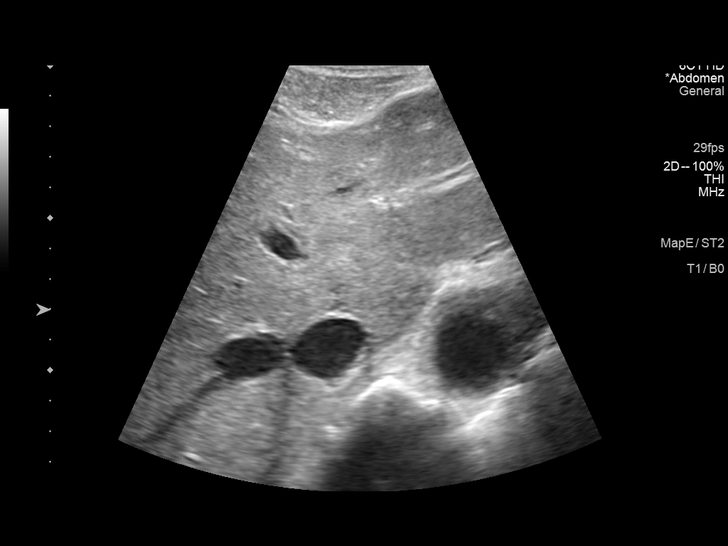
[im 5/14]
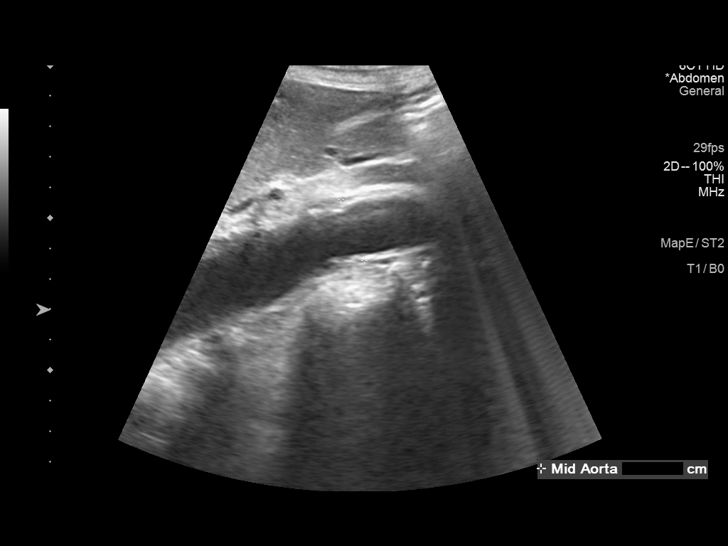
[im 6/14]
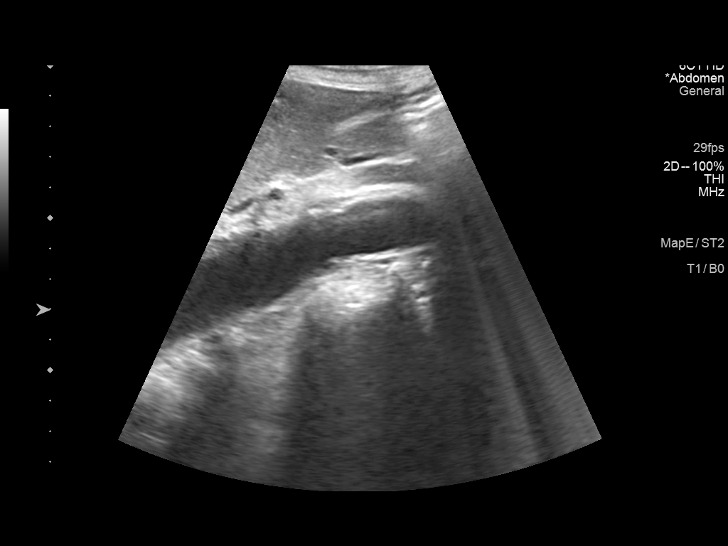
[im 7/14]
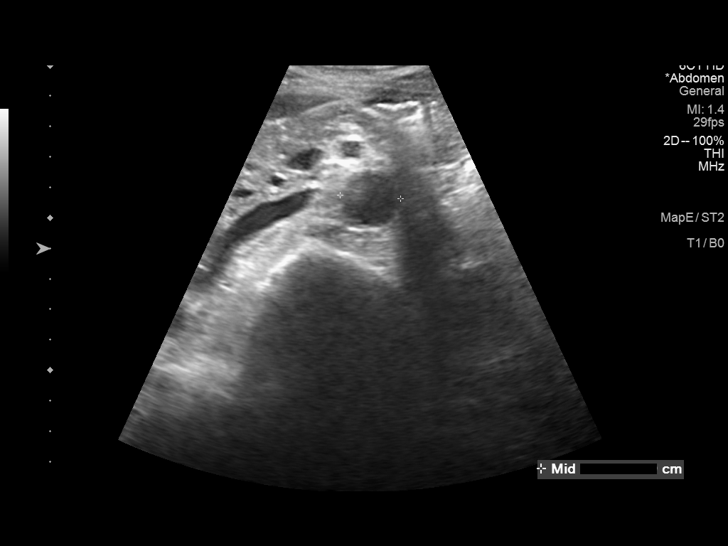
[im 8/14]
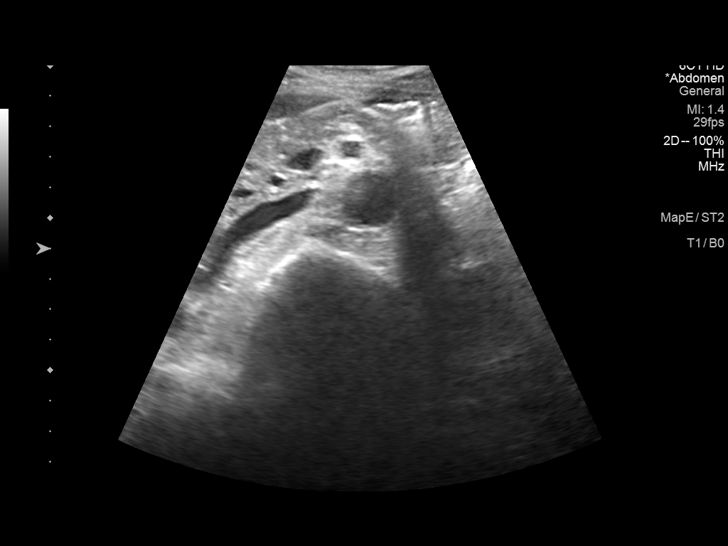
[im 9/14]
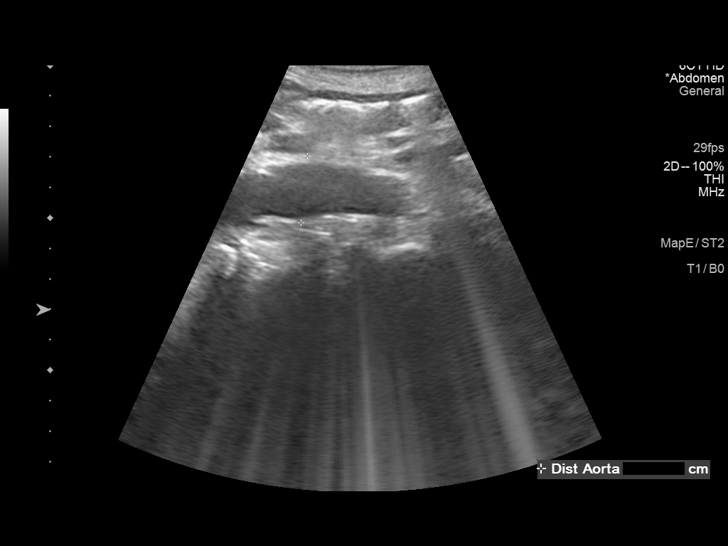
[im 10/14]
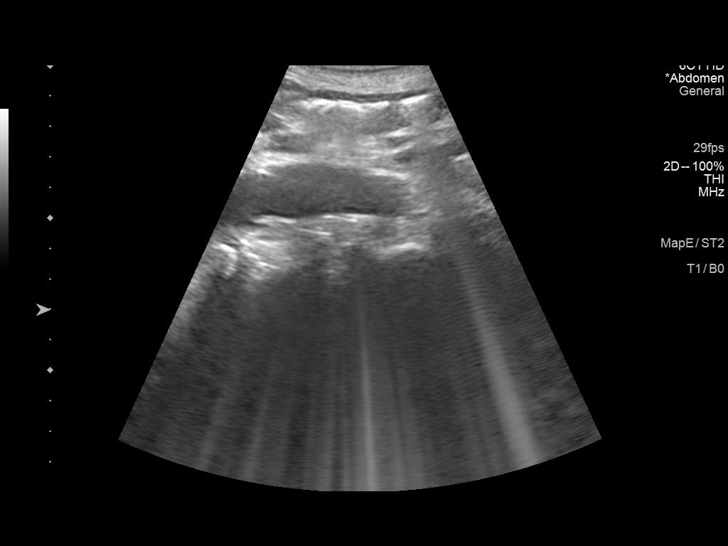
[im 11/14]
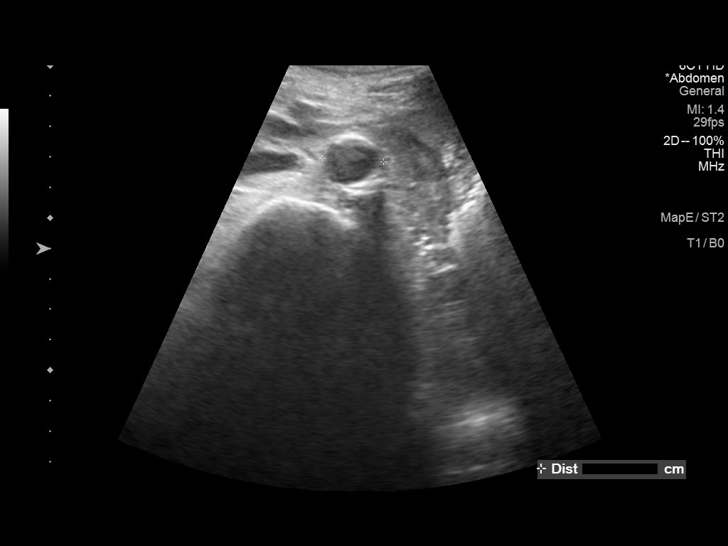
[im 12/14]
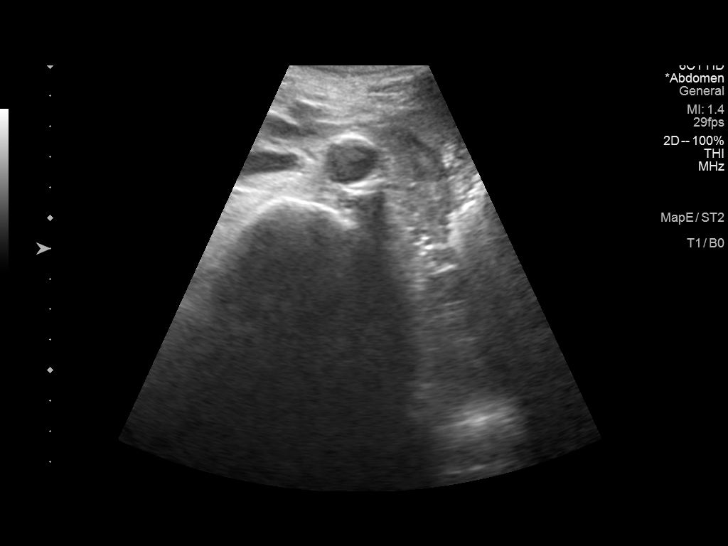
[im 13/14]
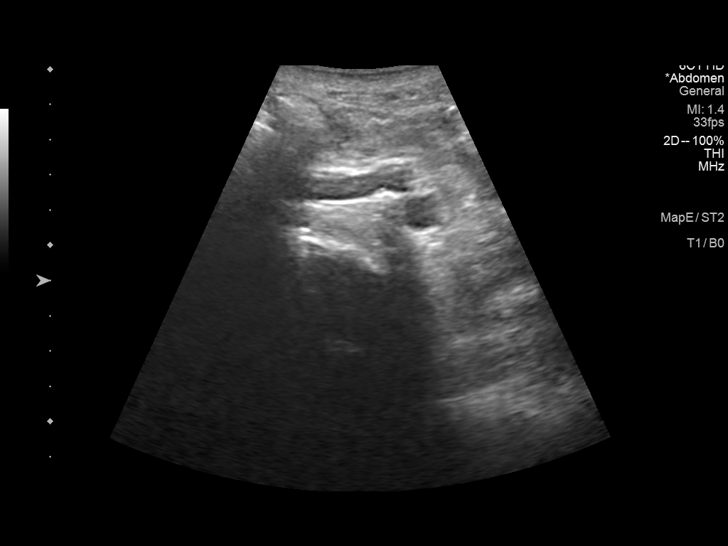
[im 14/14]
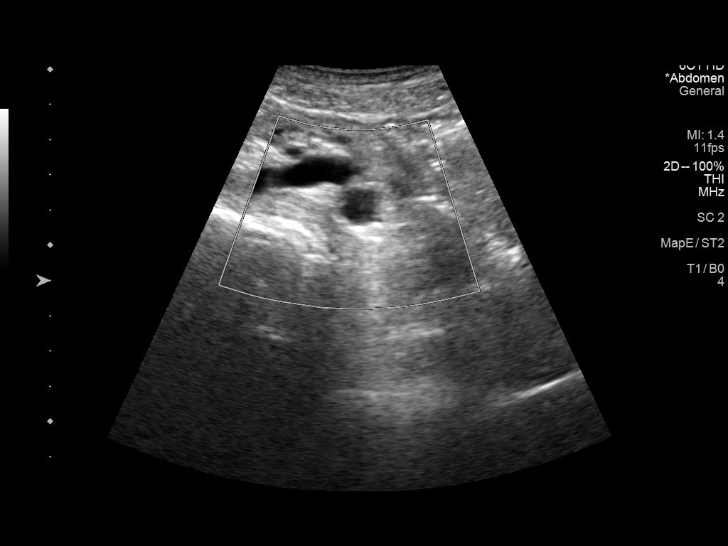

[14 of 14 positions shown; findings below may reference images not displayed]

FINDINGS: Abdominal aortic measurements as follows:

Proximal:  2.6 x 2.7 cm

Mid:  2.0 x 2.2 cm

Distal:  2.0 x 2.2 cm

Atherosclerotic plaque visualized.
IMPRESSION: Aortic atherosclerosis without evidence of abdominal aortic
aneurysm.

## 2021-10-17 ENCOUNTER — Ambulatory Visit: Payer: Self-pay

## 2021-10-17 ENCOUNTER — Ambulatory Visit (INDEPENDENT_AMBULATORY_CARE_PROVIDER_SITE_OTHER): Payer: Medicare Other | Admitting: Orthopaedic Surgery

## 2021-10-17 ENCOUNTER — Other Ambulatory Visit: Payer: Self-pay

## 2021-10-17 DIAGNOSIS — G8929 Other chronic pain: Secondary | ICD-10-CM

## 2021-10-17 DIAGNOSIS — M25511 Pain in right shoulder: Secondary | ICD-10-CM

## 2021-10-17 NOTE — Progress Notes (Signed)
The patient is well-known to me.  He is very active and muscular 66 year old gentleman who comes in with a year and a half history of worsening right dominant shoulder pain.  He was weightlifting at the time about a year and a half ago when he felt a pop in his shoulder and it was obvious that he ruptured the proximal biceps tendon.  He says the biceps definitely looks doing now in his lower riding then on his left side.  He is also having a significant out of problems with overhead activities and reaching behind him and stiffness in both shoulders as well as weakness.  He still exercises and works out on a regular basis and is tried therapy for both shoulders.  If the right one is really giving him a lot of problems.  On exam of the right shoulder he definitely has an obvious chronic proximal biceps tendon rupture.  His internal rotation with adduction is only to the belt level on both sides.  He has weakness of the rotator cuff on his right side with abduction and external rotation.  X-rays of the right shoulder show the humeral head is slightly high riding with some arthritic changes.  At this point we do need to obtain an MRI of the right shoulder to assess the rotator cuff.  There is an obvious biceps tendon rupture as well and we need to get an understanding of what the muscles are like in the tendons around his right shoulder to best develop a treatment plan.  He is in agreement of this as well.  Physical therapy is not helpful at this standpoint and the MRI will be most crucial in determining the next steps.

## 2021-11-02 ENCOUNTER — Other Ambulatory Visit: Payer: Self-pay

## 2021-11-02 ENCOUNTER — Ambulatory Visit
Admission: RE | Admit: 2021-11-02 | Discharge: 2021-11-02 | Disposition: A | Discharge status: Home / Self Care | Payer: Medicare Other | Source: Ambulatory Visit | Attending: Orthopaedic Surgery | Admitting: Orthopaedic Surgery

## 2021-11-02 DIAGNOSIS — G8929 Other chronic pain: Secondary | ICD-10-CM

## 2021-11-02 IMAGING — MR MR SHOULDER*R* W/O CM
4 of 6 series · 15 of 40 positions shown · non-contrast
Comparison: X-ray [DATE]

CLINICAL DATA: Chronic and persistent right shoulder pain with
limited range of motion

EXAM:
MRI OF THE RIGHT SHOULDER WITHOUT CONTRAST
TECHNIQUE: Multiplanar, multisequence MR imaging of the shoulder was performed.
No intravenous contrast was administered.

[Series 6: T2 fat-sat · axial · right · 3.0mm · 0.47mm/px · z∈[-32,+42]mm · 3 of 27 slices shown (1 of 2)]
[im 4/27]
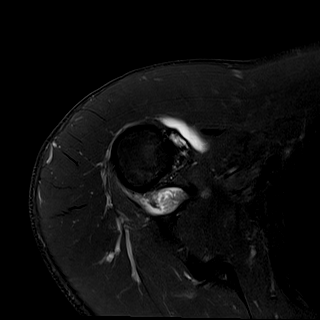
[im 15/27]
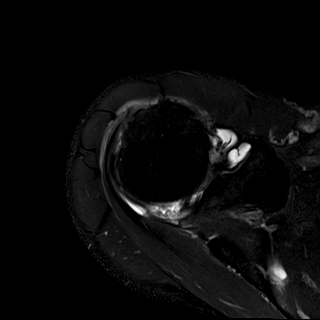
[im 23/27]
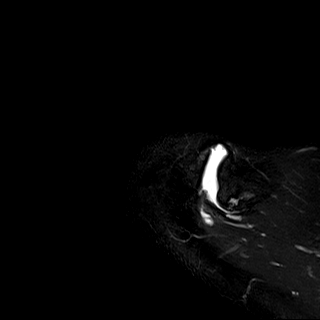

[Series 7: T2 fat-sat · oblique · right · 4.0mm · 0.22mm/px · 3 of 18 slices shown (2 of 2)]
[im 4/18]
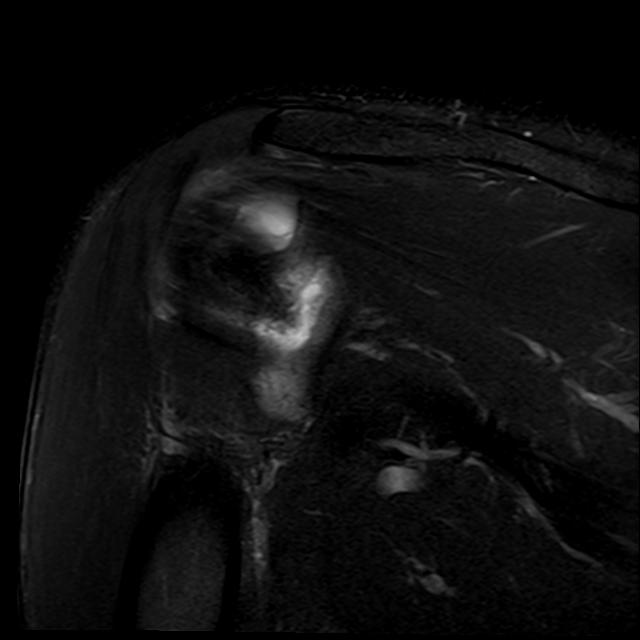
[im 11/18]
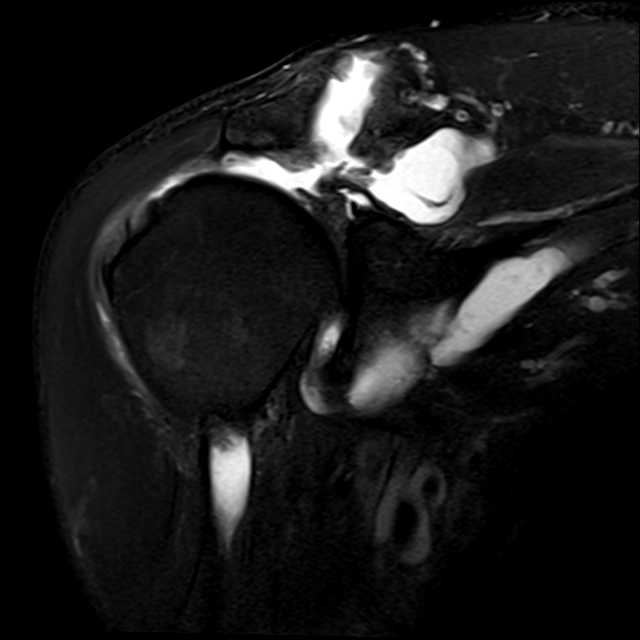
[im 18/18]
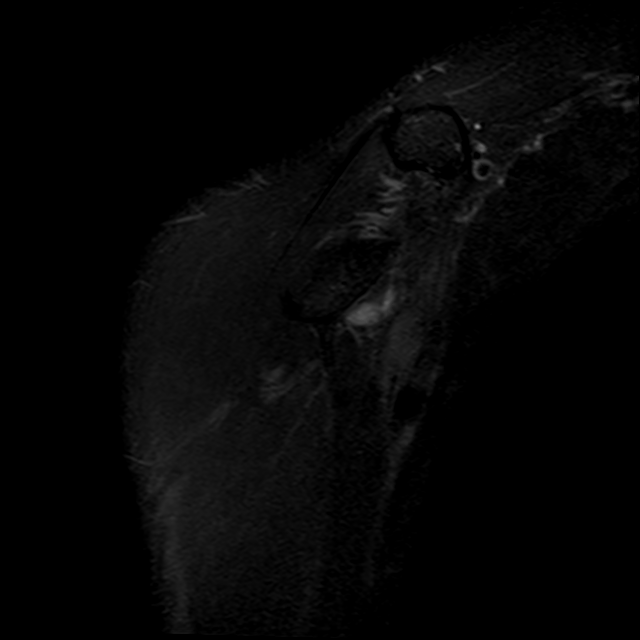

[Series 9: PD · oblique · right · 4.0mm · 0.22mm/px · 6 of 18 slices shown (1 of 2)]
[im 1/18]
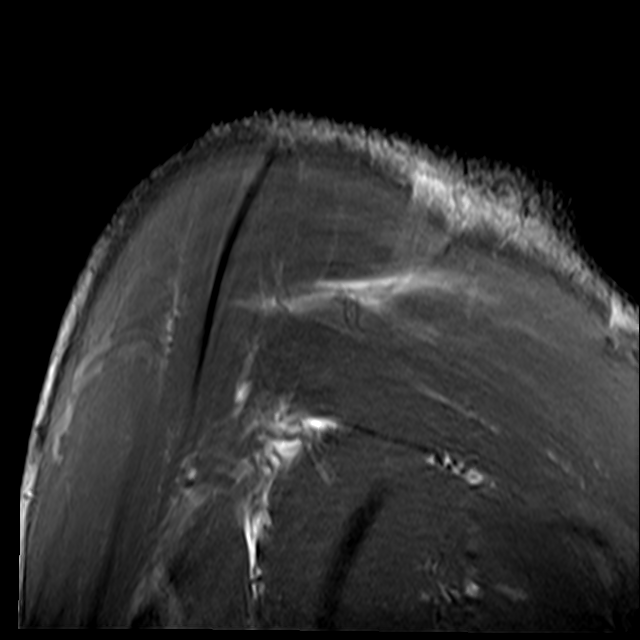
[im 4/18]
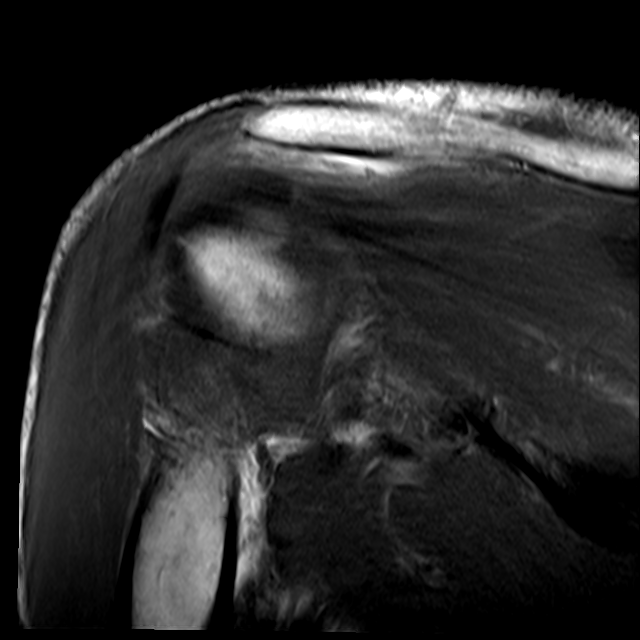
[im 7/18]
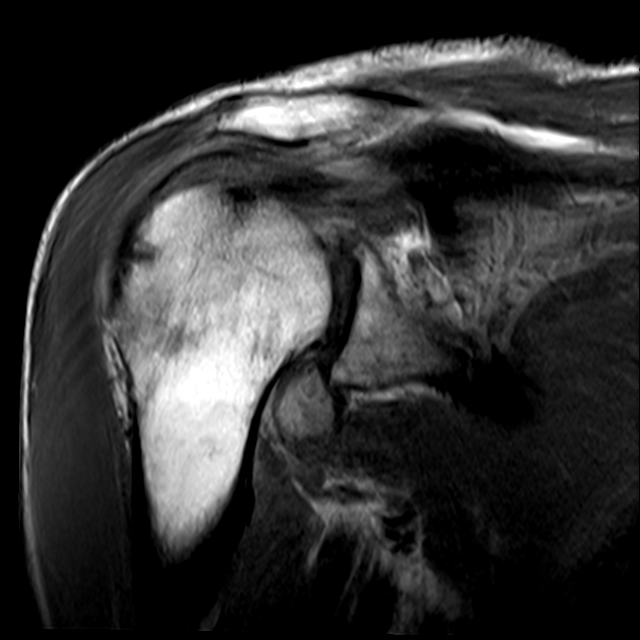
[im 11/18]
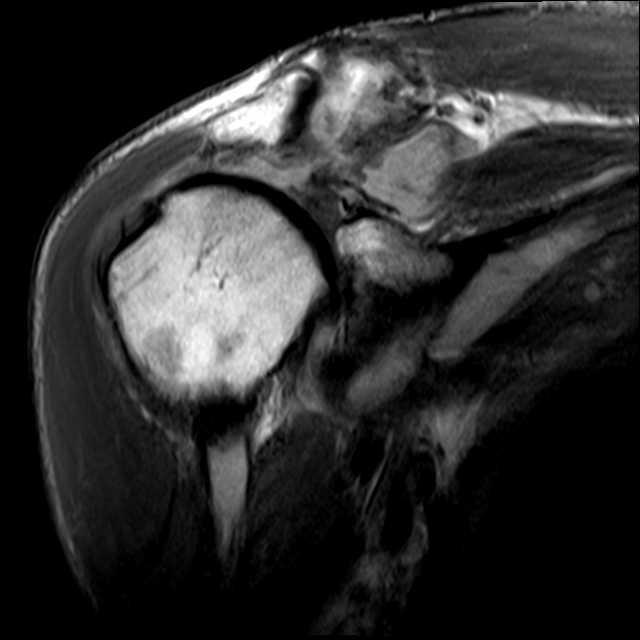
[im 14/18]
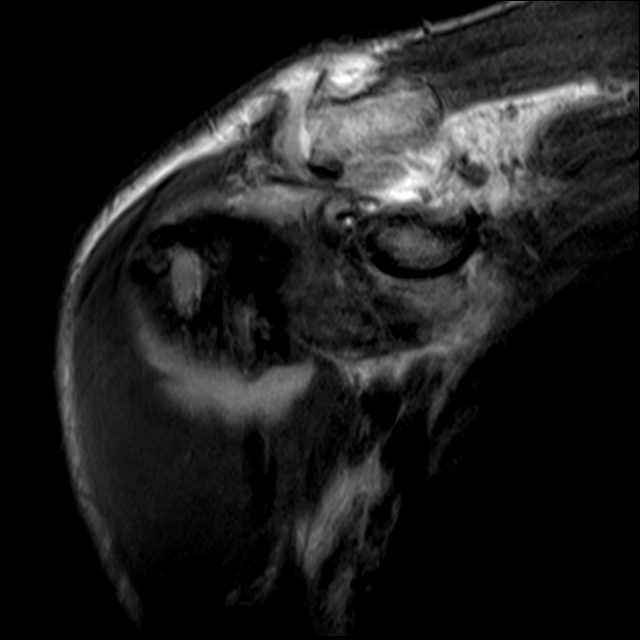
[im 18/18]
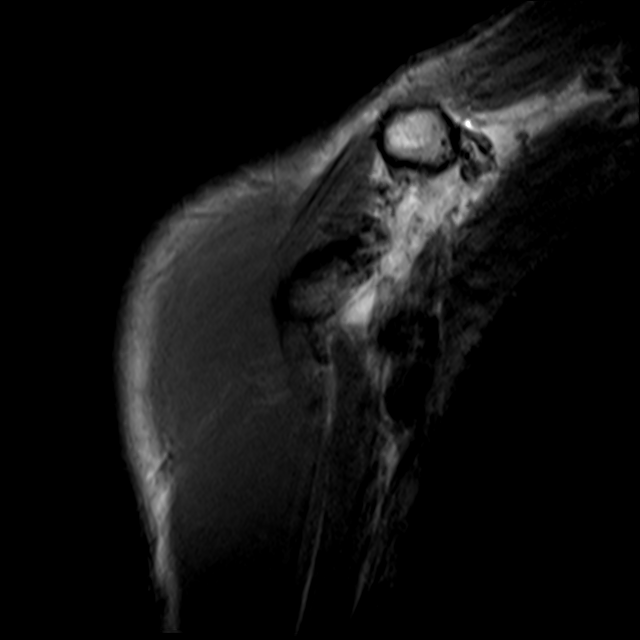

[Series 11: PD · oblique · right · 4.0mm · 0.22mm/px · 3 of 18 slices shown (2 of 2)]
[im 4/18]
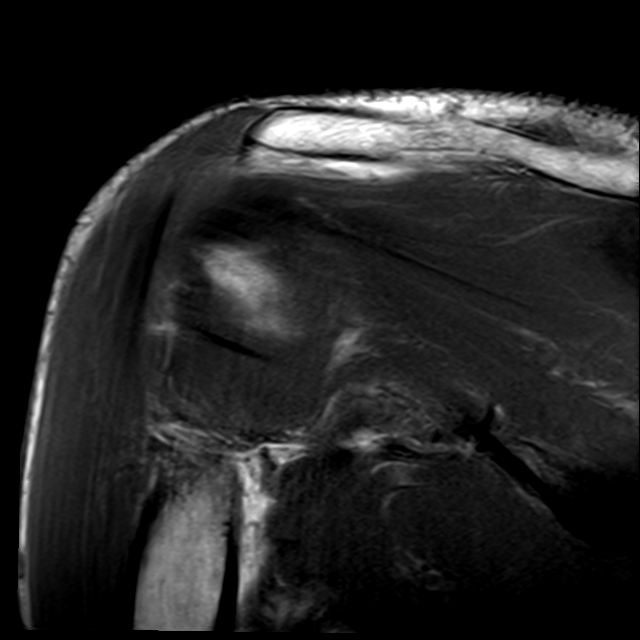
[im 11/18]
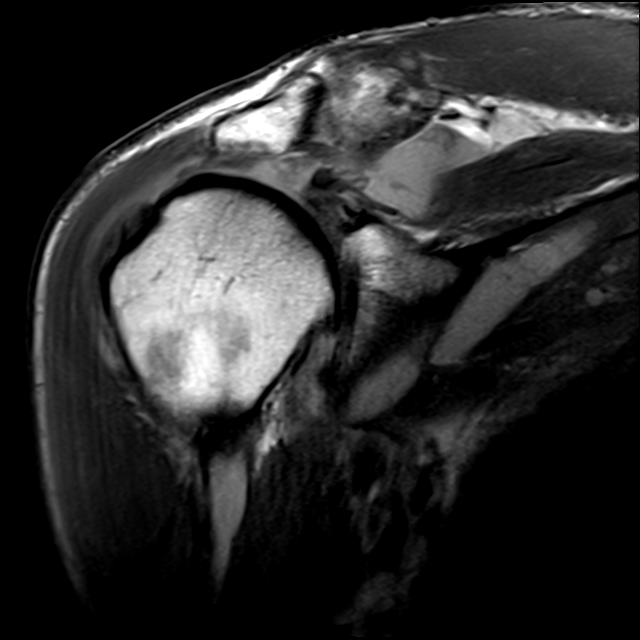
[im 18/18]
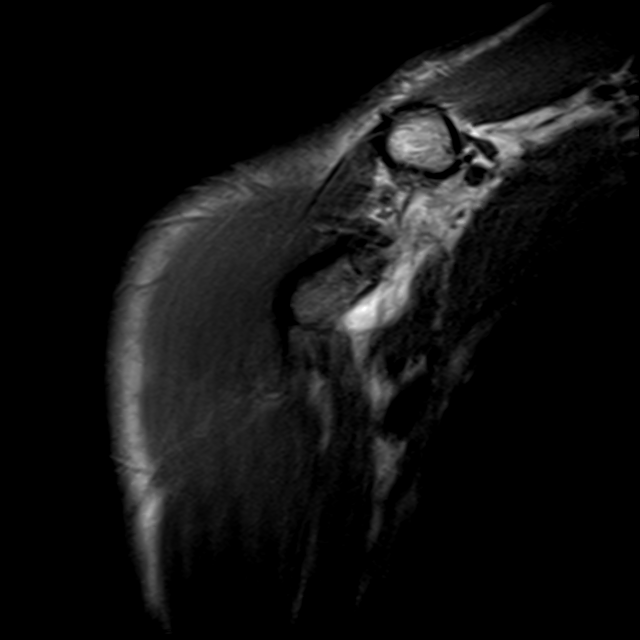

[15 of 40 positions shown; findings below may reference images not displayed]

FINDINGS: Rotator cuff: Complete full-thickness tear of the supraspinatus
tendon retracted to the level of the glenohumeral joint.
Full-thickness, incomplete tear of the distal subscapularis tendon
along its cranial margin. Severe infraspinatus tendinosis with
high-grade partial-thickness articular surface tearing distally and
within the region of the critical zone. Intact teres minor.

Muscles: Mild supraspinatus intramuscular edema. No muscle atrophy
or fatty infiltration.

Biceps long head: Nonvisualization of the long head biceps tendon,
likely torn and retracted. Fluid within the biceps tendon sheath.

Acromioclavicular Joint: Severe degenerative changes of the AC
joint. Large AC joint effusion. Small volume subacromial-subdeltoid
bursal fluid communicating with the glenohumeral joint space.

Glenohumeral Joint: Moderate-sized complex joint effusion.
Mild-moderate chondral thinning and surface irregularity. Humeral
head is high-riding relative to the glenoid.

Labrum:  Diffuse labral degeneration and tearing.

Bones: No acute fracture. No dislocation. No bone marrow edema. No
marrow replacing bone lesion.

Other: None.
IMPRESSION: 1. Complete full-thickness retracted tear of the supraspinatus
tendon.
2. Full-thickness, incomplete tear of the distal subscapularis
tendon.
3. Severe infraspinatus tendinosis with high-grade partial-thickness
tearing.
4. Nonvisualization of the long head biceps tendon, likely torn and
retracted.
5. Severe AC joint osteoarthritis with large AC joint effusion.
6. Mild-to-moderate glenohumeral osteoarthritis. Moderate-sized
complex glenohumeral joint effusion.

## 2021-11-09 ENCOUNTER — Encounter: Payer: Self-pay | Admitting: Orthopaedic Surgery

## 2021-11-09 ENCOUNTER — Ambulatory Visit (INDEPENDENT_AMBULATORY_CARE_PROVIDER_SITE_OTHER): Payer: Medicare Other | Admitting: Orthopaedic Surgery

## 2021-11-09 DIAGNOSIS — G8929 Other chronic pain: Secondary | ICD-10-CM | POA: Diagnosis not present

## 2021-11-09 DIAGNOSIS — M25511 Pain in right shoulder: Secondary | ICD-10-CM | POA: Diagnosis not present

## 2021-11-09 NOTE — Progress Notes (Signed)
The patient is well-known to me.  He comes in today to go over MRI of his right shoulder.  He has had some chronic and acute issues with his right shoulder.  He is works out aggressively on a regular basis.  He knows that he has a proximal biceps tendon rupture on the right side that is chronic but then he has had worsening weakness and pain with weightlifting activities and is gotten to where he cannot use his shoulder well. ? ?I did go over the MRI report with him.  He has full-thickness rotator cuff tear of the supraspinatus that is retracted back to the level of the glenoid.  There is also tearing of the subscapularis tendon.  There is arthritic changes of the glenohumeral joint and the AC joint.  The biceps tendon is chronically torn proximally and retracted. ? ?At this point I went over a shoulder model and explained to him what is going on with his right shoulder.  I would like to send him to my partner Dr. Marlou Sa for further ration and treatment of this chronic and acute shoulder issue so he can go over potential surgical treatment options at this point based on worsening on clinical exam and MRI findings.  There is profound weakness of the shoulder.  The patient agrees with this treatment plan. ?

## 2021-11-16 ENCOUNTER — Ambulatory Visit: Payer: Medicare Other | Admitting: Orthopedic Surgery

## 2021-11-16 DIAGNOSIS — G8929 Other chronic pain: Secondary | ICD-10-CM

## 2021-11-16 DIAGNOSIS — M25511 Pain in right shoulder: Secondary | ICD-10-CM

## 2021-11-18 ENCOUNTER — Ambulatory Visit: Payer: Medicare Other | Admitting: Orthopedic Surgery

## 2021-11-18 ENCOUNTER — Encounter: Payer: Self-pay | Admitting: Orthopedic Surgery

## 2021-11-18 NOTE — Progress Notes (Signed)
? ?Office Visit Note ?  ?Patient: Danny Bender           ?Date of Birth: 11-24-1955           ?MRN: [id number] ?Visit Date: 11/16/2021 ?Requested by: Nickola Major, MD ?[address] [place],  [place] [address] ?PCP: Nickola Major, MD ? ?Subjective: ?Chief Complaint  ?Patient presents with  ? Right Shoulder - Pain  ? ? ?HPI: Danny Bender is a active 66 year old patient with right shoulder pain.  Pain has been going on for several years.  He is right-hand dominant.  The pain comes and goes.  He likes to work out a lot.  Play baseball through college.  Reports some decreased strength particularly with overhead activities in the right arm.  Feels like he may have started this constellation of pain when he was throwing rocks around the pond.  Instead of weights he is doing resistance training.  He does workout with weights in different areas of his body.  Likes to do yard work as well.  He is able to go overhead with light weights.  Reports anterior pain at night.  Has a history of neck fusion decades ago.  Has not had any injections and does not take medication for the problem.  He has had an MRI scan of the right shoulder which is reviewed and shows complete full-thickness tear of the supraspinatus with retraction to the level of the glenohumeral joint along with full-thickness incomplete tear of the distal subscapularis tendon at its upper margin and severe infraspinatus tendinosis with high-grade partial-thickness articular surface tearing as well as ruptured biceps tendon with no tendon left in the joint. ?             ?ROS: All systems reviewed are negative as they relate to the chief complaint within the history of present illness.  Patient denies  fevers or chills. ? ? ?Assessment & Plan: ?Visit Diagnoses:  ?1. Chronic right shoulder pain   ? ? ?Plan: Impression is right shoulder pain with early rotator cuff arthropathy but good anterior posterior force couple to give the patient pretty reasonable  function.  I do not think surgical intervention is indicated at this time which for him would be reverse shoulder replacement.  He is going to continue to modify his activity level to suit the level of symptom he is having.  He will follow-up with [place] as needed. ? ?Follow-Up Instructions: Return if symptoms worsen or fail to improve.  ? ?Orders:  ?No orders of the defined types were placed in this encounter. ? ?No orders of the defined types were placed in this encounter. ? ? ? ? Procedures: ?No procedures performed ? ? ?Clinical Data: ?No additional findings. ? ?Objective: ?Vital Signs: There were no vitals taken for this visit. ? ?Physical Exam:  ? ?Constitutional: Patient appears well-developed ?HEENT:  ?Head: Normocephalic ?Eyes:EOM are normal ?Neck: Normal range of motion ?Cardiovascular: Normal rate ?Pulmonary/chest: Effort normal ?Neurologic: Patient is alert ?Skin: Skin is warm ?Psychiatric: Patient has normal mood and affect ? ? ?Ortho Exam: Ortho exam demonstrates good cervical spine range of motion.  Has 5 out of 5 grip EPL FPL interosseous wrist flexion extension bicep triceps and deltoid strength.  Has pretty good muscle development for patient in his age group.  Deltoid strength is symmetric bilaterally.  Passive range of motion is 55/95/170 bilaterally.  No discrete AC joint tenderness right versus left.  Does have external rotation weakness on the right versus left.  Subscap strength intact symmetrically 5+ out of 5.  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  Motor sensory function otherwise intact to the hand ? ?Specialty Comments:  ?No specialty comments available. ? ?Imaging: ?No results found. ? ? ?PMFS History: ?Patient Active Problem List  ? Diagnosis Date Noted  ? Encounter to establish care 08/27/2018  ? History of gout 08/27/2018  ? 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 Cellulitis 08/20/2017  ? Status post total replacement of left hip 08/10/2017  ? Chronic left hip pain  04/24/2017  ? Unilateral primary osteoarthritis, left hip 04/24/2017  ? ?Past Medical History:  ?Diagnosis Date  ? Arthritis   ? "shoulders, hands, left wrist" (08/22/2017)  ? Basal cell carcinoma   ? "used nitrogen chin, ?right" (08/22/2017)  ? Chronic back pain   ? Chronic lower back pain   ? Complication of anesthesia   ? took days to clear head  ? DDD (degenerative disc disease), cervical   ? DDD (degenerative disc disease), lumbar   ? Discoloration of skin   ? fingers and toe   ? Family history of adverse reaction to anesthesia   ? "sister had a problem; don't remember what it was"  ? Gout   ? History of shingles   ? Nasal polyps   ? OA (osteoarthritis) of hip   ? Ruptured disk   ? lumbar  ?  ?History reviewed. No pertinent family history.  ?Past Surgical History:  ?Procedure Laterality Date  ? ABDOMINAL HERNIA REPAIR  2004  ? ANTERIOR CERVICAL DECOMP/DISCECTOMY FUSION  1991  ? HERNIA REPAIR    ? INGUINAL HERNIA REPAIR Right 11/04/2020  ? Procedure: LAPAROSCOPIC REPAIR RIGHT INGUINAL HERNIA WITH MESH, EXCISION OF PERITONEAL MASS, AND LEFT FEMORAL HERNIA ;  Surgeon: Michael Boston, MD;  Location: [facility];  Service: General;  Laterality: Right;  ROOM 4 STARTING AT 07:30AM FOR 60 MIN  ? JOINT REPLACEMENT    ? TOTAL HIP ARTHROPLASTY Left 08/10/2017  ? Procedure: LEFT TOTAL HIP ARTHROPLASTY ANTERIOR APPROACH;  Surgeon: Mcarthur Rossetti, MD;  Location: [facility];  Service: Orthopedics;  Laterality: Left;  ? ?Social History  ? ?Occupational History  ? Not on file  ?Tobacco Use  ? Smoking status: Former  ?  Packs/day: 2.00  ?  Years: 10.00  ?  Pack years: 20.00  ?  Types: Cigarettes  ?  Quit date: 08/21/2002  ?  Years since quitting: 19.2  ? Smokeless tobacco: Never  ?Vaping Use  ? Vaping Use: Never used  ?Substance and Sexual Activity  ? Alcohol use: Not Currently  ?  Alcohol/week: 7.0 standard drinks  ?  Types: 7 Standard drinks or equivalent per week  ? Drug use: Yes  ?  Types: Marijuana  ?  Comment: 08/22/2017 "just  started recently; to help w/pain"  ? Sexual activity: Not Currently  ? ? ? ? ? ?

## 2022-01-05 ENCOUNTER — Telehealth: Payer: Self-pay | Admitting: Orthopedic Surgery

## 2022-01-05 NOTE — Telephone Encounter (Signed)
 Patient left a VM that he wants to know the name of an Orthopedic Surgeon for shoulder replacement and the name of a Neurologist. He also said he probably would not be able to answer the phone. Call [phone]

## 2022-01-05 NOTE — Telephone Encounter (Signed)
Danny Bender is calling requesting a call back from Dr. Marlou Sa in regards to the shoulder replacement Dr. Marlou Sa advised. He has several questions regarding the procedure. Please advise!

## 2022-01-05 NOTE — Telephone Encounter (Signed)
 [phone] lmom to return call    Electronically signed by: Camelia Joshua Hopping, CMA 01/05/22 1629

## 2022-01-09 NOTE — Telephone Encounter (Signed)
I called and discussed at length  - many questions answered

## 2022-08-28 ENCOUNTER — Telehealth: Payer: Self-pay | Admitting: Orthopedic Surgery

## 2022-08-28 NOTE — Telephone Encounter (Signed)
Okay to try indomethacin for gout.  1 by mouth twice a day for 5 days 50 mg.  Okay to come in Monday.  Could be gout flare in the shoulder.  He may also have some type of flulike sickness on top of that.

## 2022-08-28 NOTE — Telephone Encounter (Addendum)
Patient called advised his right shoulder and right arm is swollen and he can not raise the arm. Patient said this started Saturday and is getting worse. Patient asked if he can be seen today if possible.  Patient asked if there is anything he can take to make the pain easier to tolerate. The  number to contact patient is [phone]

## 2022-08-29 NOTE — Telephone Encounter (Signed)
Tried calling patient to discuss. No answer. LMVM with details per Dr Forbes Cellar note. Advised to call us back to get worked in sometime next week.

## 2022-08-31 ENCOUNTER — Telehealth: Payer: Self-pay | Admitting: Orthopedic Surgery

## 2022-08-31 NOTE — Telephone Encounter (Signed)
Patient is trying to return call to Ander Purpura would rather speak with her personally did not want me to try and get appt scheduled he would like to get scheduled anytime monday

## 2022-08-31 NOTE — Telephone Encounter (Signed)
Dr Marlou Sa had a cancellation at 145pm on Monday afternoon.  I scheduled patient, tried calling him to advise, no answer. LMVM with detail advising of appt date and time.

## 2022-09-04 ENCOUNTER — Encounter: Payer: Self-pay | Admitting: Orthopedic Surgery

## 2022-09-04 ENCOUNTER — Ambulatory Visit (INDEPENDENT_AMBULATORY_CARE_PROVIDER_SITE_OTHER): Payer: Medicare Other

## 2022-09-04 ENCOUNTER — Ambulatory Visit: Payer: Medicare Other | Admitting: Orthopedic Surgery

## 2022-09-04 DIAGNOSIS — M25511 Pain in right shoulder: Secondary | ICD-10-CM | POA: Diagnosis not present

## 2022-09-04 DIAGNOSIS — Z981 Arthrodesis status: Secondary | ICD-10-CM

## 2022-09-04 DIAGNOSIS — G8929 Other chronic pain: Secondary | ICD-10-CM

## 2022-09-04 NOTE — Progress Notes (Signed)
Office Visit Note   Patient: Danny Bender           Date of Birth: Jan 26, 1956           MRN: [id number] Visit Date: 09/04/2022 Requested by: Nickola Major, MD [address] [place],  [place] [address] PCP: Nickola Major, MD  Subjective: Chief Complaint  Patient presents with   Right Shoulder - Pain    HPI: Danny Bender is a 67 y.o. male who presents to the office reporting right shoulder pain.  He states that 10 days ago he went to bed but woke up at 3 AM with severe right shoulder pain.  Had some subsequent swelling in his hand.  Not really done anything physical that day or the day before.  Denies any fevers or chills.  Overall he has been improving.  Has been taking indomethacin.  Has a remote history of gout.  Also has had a neck fusion with Dr. Sherwood Gambler.  He is a very active and fit person.  Did have an MRI scan in 11/07/2021 which shows significant rotator cuff pathology with retraction..                ROS: All systems reviewed are negative as they relate to the chief complaint within the history of present illness.  Patient denies fevers or chills.  Assessment & Plan: Visit Diagnoses:  1. Chronic right shoulder pain   2. History of fusion of cervical spine     Plan: Impression is right shoulder pain which has improved.  I do not detect subscap tearing on examination today.  Most likely this represents gout flare in the shoulder.  Injection could be considered next time this happens.  Overall he does have forward flexion and abduction strength above shoulder level and appears to be getting close to his baseline.  We talked a lot about how to work out today to protect the subscap which had partial tearing a year ago.  If the subscap goes then his shoulder will become much less functional.  Follow-up with [place] as needed.  Follow-Up Instructions: No follow-ups on file.   Orders:  Orders Placed This Encounter  Procedures   XR Shoulder Right   XR Cervical  Spine 2 or 3 views   No orders of the defined types were placed in this encounter.     Procedures: No procedures performed   Clinical Data: No additional findings.  Objective: Vital Signs: There were no vitals taken for this visit.  Physical Exam:  Constitutional: Patient appears well-developed HEENT:  Head: Normocephalic Eyes:EOM are normal Neck: Normal range of motion Cardiovascular: Normal rate Pulmonary/chest: Effort normal Neurologic: Patient is alert Skin: Skin is warm Psychiatric: Patient has normal mood and affect  Ortho Exam: Ortho exam demonstrates Popeye deformity present.  Deltoid is functional.  He does have forward flexion and abduction both above 90 degrees.  External rotation strength 4- out of 5 on the right 4+ out of 5 on the left.  Subscap strength 5+ out of 5 bilaterally.  Some crepitus is present.  With right shoulder passive range of motion.  Specialty Comments:  No specialty comments available.  Imaging: No results found.   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    Cellulitis 08/20/2017   Status post total replacement of left hip 08/10/2017  Chronic left hip pain 04/24/2017   Unilateral primary osteoarthritis, left hip 04/24/2017   Past Medical History:  Diagnosis Date   Arthritis    "shoulders, hands, left wrist" (08/22/2017)   Basal cell carcinoma    "used nitrogen chin, ?right" (08/22/2017)   Chronic back pain    Chronic lower back pain    Complication of anesthesia    took days to clear head   DDD (degenerative disc disease), cervical    DDD (degenerative disc disease), lumbar    Discoloration of skin    fingers and toe    Family history of adverse reaction to anesthesia    "sister had a problem; don't remember what it was"   Gout    History of shingles    Nasal polyps    OA  (osteoarthritis) of hip    Ruptured disk    lumbar    No family history on file.  Past Surgical History:  Procedure Laterality Date   ABDOMINAL HERNIA REPAIR  2004   ANTERIOR CERVICAL DECOMP/DISCECTOMY FUSION  1991   HERNIA REPAIR     INGUINAL HERNIA REPAIR Right 11/04/2020   Procedure: LAPAROSCOPIC REPAIR RIGHT INGUINAL HERNIA WITH MESH, EXCISION OF PERITONEAL MASS, AND LEFT FEMORAL HERNIA ;  Surgeon: Michael Boston, MD;  Location: [facility];  Service: General;  Laterality: Right;  ROOM 4 STARTING AT 07:30AM FOR 60 MIN   JOINT REPLACEMENT     TOTAL HIP ARTHROPLASTY Left 08/10/2017   Procedure: LEFT TOTAL HIP ARTHROPLASTY ANTERIOR APPROACH;  Surgeon: Mcarthur Rossetti, MD;  Location: [facility];  Service: Orthopedics;  Laterality: Left;   Social History   Occupational History   Not on file  Tobacco Use   Smoking status: Former    Packs/day: 2.00    Years: 10.00    Total pack years: 20.00    Types: Cigarettes    Quit date: 08/21/2002    Years since quitting: 20.0   Smokeless tobacco: Never  Vaping Use   Vaping Use: Never used  Substance and Sexual Activity   Alcohol use: Not Currently    Alcohol/week: 7.0 standard drinks of alcohol    Types: 7 Standard drinks or equivalent per week   Drug use: Yes    Types: Marijuana    Comment: 08/22/2017 "just started recently; to help w/pain"   Sexual activity: Not Currently

## 2022-10-19 NOTE — Telephone Encounter (Signed)
Error

## 2023-05-03 DIAGNOSIS — M0600 Rheumatoid arthritis without rheumatoid factor, unspecified site: Secondary | ICD-10-CM | POA: Diagnosis not present

## 2023-05-17 DIAGNOSIS — Z6820 Body mass index (BMI) 20.0-20.9, adult: Secondary | ICD-10-CM | POA: Diagnosis not present

## 2023-05-17 DIAGNOSIS — M25511 Pain in right shoulder: Secondary | ICD-10-CM | POA: Diagnosis not present

## 2023-05-17 DIAGNOSIS — M79641 Pain in right hand: Secondary | ICD-10-CM | POA: Diagnosis not present

## 2023-05-17 DIAGNOSIS — M79642 Pain in left hand: Secondary | ICD-10-CM | POA: Diagnosis not present

## 2023-05-17 DIAGNOSIS — M1991 Primary osteoarthritis, unspecified site: Secondary | ICD-10-CM | POA: Diagnosis not present

## 2023-05-17 DIAGNOSIS — R5382 Chronic fatigue, unspecified: Secondary | ICD-10-CM | POA: Diagnosis not present

## 2023-05-17 DIAGNOSIS — G8929 Other chronic pain: Secondary | ICD-10-CM | POA: Diagnosis not present

## 2023-05-17 DIAGNOSIS — M0609 Rheumatoid arthritis without rheumatoid factor, multiple sites: Secondary | ICD-10-CM | POA: Diagnosis not present

## 2023-06-28 DIAGNOSIS — M0609 Rheumatoid arthritis without rheumatoid factor, multiple sites: Secondary | ICD-10-CM | POA: Diagnosis not present

## 2023-06-28 DIAGNOSIS — G8929 Other chronic pain: Secondary | ICD-10-CM | POA: Diagnosis not present

## 2023-06-28 DIAGNOSIS — R5382 Chronic fatigue, unspecified: Secondary | ICD-10-CM | POA: Diagnosis not present

## 2023-06-28 DIAGNOSIS — M79642 Pain in left hand: Secondary | ICD-10-CM | POA: Diagnosis not present

## 2023-06-28 DIAGNOSIS — M79641 Pain in right hand: Secondary | ICD-10-CM | POA: Diagnosis not present

## 2023-06-28 DIAGNOSIS — Z6820 Body mass index (BMI) 20.0-20.9, adult: Secondary | ICD-10-CM | POA: Diagnosis not present

## 2023-06-28 DIAGNOSIS — M25511 Pain in right shoulder: Secondary | ICD-10-CM | POA: Diagnosis not present

## 2023-07-11 DIAGNOSIS — M25511 Pain in right shoulder: Secondary | ICD-10-CM | POA: Diagnosis not present

## 2023-07-16 DIAGNOSIS — Z136 Encounter for screening for cardiovascular disorders: Secondary | ICD-10-CM | POA: Diagnosis not present

## 2023-07-16 DIAGNOSIS — I739 Peripheral vascular disease, unspecified: Secondary | ICD-10-CM | POA: Diagnosis not present

## 2023-07-16 DIAGNOSIS — M0600 Rheumatoid arthritis without rheumatoid factor, unspecified site: Secondary | ICD-10-CM | POA: Diagnosis not present

## 2023-07-16 DIAGNOSIS — Z79899 Other long term (current) drug therapy: Secondary | ICD-10-CM | POA: Diagnosis not present

## 2023-07-16 DIAGNOSIS — Z9181 History of falling: Secondary | ICD-10-CM | POA: Diagnosis not present

## 2023-07-16 DIAGNOSIS — M19011 Primary osteoarthritis, right shoulder: Secondary | ICD-10-CM | POA: Diagnosis not present

## 2023-07-16 DIAGNOSIS — Z1211 Encounter for screening for malignant neoplasm of colon: Secondary | ICD-10-CM | POA: Diagnosis not present

## 2023-07-16 DIAGNOSIS — Z0001 Encounter for general adult medical examination with abnormal findings: Secondary | ICD-10-CM | POA: Diagnosis not present

## 2023-07-26 ENCOUNTER — Other Ambulatory Visit: Payer: Self-pay | Admitting: Family Medicine

## 2023-07-26 DIAGNOSIS — R7401 Elevation of levels of liver transaminase levels: Secondary | ICD-10-CM

## 2023-08-06 ENCOUNTER — Ambulatory Visit
Admission: RE | Admit: 2023-08-06 | Discharge: 2023-08-06 | Disposition: A | Discharge status: Home / Self Care | Payer: Medicare Other | Source: Ambulatory Visit | Attending: Family Medicine | Admitting: Family Medicine

## 2023-08-06 DIAGNOSIS — R7401 Elevation of levels of liver transaminase levels: Secondary | ICD-10-CM

## 2023-08-28 DIAGNOSIS — Z1211 Encounter for screening for malignant neoplasm of colon: Secondary | ICD-10-CM | POA: Diagnosis not present

## 2023-08-29 DIAGNOSIS — M79641 Pain in right hand: Secondary | ICD-10-CM | POA: Diagnosis not present

## 2023-08-29 DIAGNOSIS — Z6821 Body mass index (BMI) 21.0-21.9, adult: Secondary | ICD-10-CM | POA: Diagnosis not present

## 2023-08-29 DIAGNOSIS — M542 Cervicalgia: Secondary | ICD-10-CM | POA: Diagnosis not present

## 2023-08-29 DIAGNOSIS — R5382 Chronic fatigue, unspecified: Secondary | ICD-10-CM | POA: Diagnosis not present

## 2023-08-29 DIAGNOSIS — M0609 Rheumatoid arthritis without rheumatoid factor, multiple sites: Secondary | ICD-10-CM | POA: Diagnosis not present

## 2023-08-29 DIAGNOSIS — M79642 Pain in left hand: Secondary | ICD-10-CM | POA: Diagnosis not present

## 2023-08-29 DIAGNOSIS — I7300 Raynaud's syndrome without gangrene: Secondary | ICD-10-CM | POA: Diagnosis not present

## 2023-08-29 DIAGNOSIS — M25511 Pain in right shoulder: Secondary | ICD-10-CM | POA: Diagnosis not present

## 2023-08-29 DIAGNOSIS — G8929 Other chronic pain: Secondary | ICD-10-CM | POA: Diagnosis not present

## 2023-09-21 DIAGNOSIS — L309 Dermatitis, unspecified: Secondary | ICD-10-CM | POA: Diagnosis not present

## 2023-10-24 DIAGNOSIS — D696 Thrombocytopenia, unspecified: Secondary | ICD-10-CM | POA: Diagnosis not present

## 2023-11-01 DIAGNOSIS — L309 Dermatitis, unspecified: Secondary | ICD-10-CM | POA: Diagnosis not present

## 2023-11-01 DIAGNOSIS — M79671 Pain in right foot: Secondary | ICD-10-CM | POA: Diagnosis not present

## 2023-11-21 DIAGNOSIS — M25511 Pain in right shoulder: Secondary | ICD-10-CM | POA: Diagnosis not present

## 2023-12-04 DIAGNOSIS — Z6820 Body mass index (BMI) 20.0-20.9, adult: Secondary | ICD-10-CM | POA: Diagnosis not present

## 2023-12-04 DIAGNOSIS — M542 Cervicalgia: Secondary | ICD-10-CM | POA: Diagnosis not present

## 2023-12-04 DIAGNOSIS — M0609 Rheumatoid arthritis without rheumatoid factor, multiple sites: Secondary | ICD-10-CM | POA: Diagnosis not present

## 2023-12-04 DIAGNOSIS — G629 Polyneuropathy, unspecified: Secondary | ICD-10-CM | POA: Diagnosis not present

## 2023-12-04 DIAGNOSIS — M25511 Pain in right shoulder: Secondary | ICD-10-CM | POA: Diagnosis not present

## 2023-12-04 DIAGNOSIS — R5382 Chronic fatigue, unspecified: Secondary | ICD-10-CM | POA: Diagnosis not present

## 2023-12-04 DIAGNOSIS — I7300 Raynaud's syndrome without gangrene: Secondary | ICD-10-CM | POA: Diagnosis not present

## 2023-12-04 DIAGNOSIS — G8929 Other chronic pain: Secondary | ICD-10-CM | POA: Diagnosis not present

## 2024-01-07 DIAGNOSIS — M25511 Pain in right shoulder: Secondary | ICD-10-CM | POA: Diagnosis not present

## 2024-02-18 DIAGNOSIS — M0600 Rheumatoid arthritis without rheumatoid factor, unspecified site: Secondary | ICD-10-CM | POA: Diagnosis not present

## 2024-02-18 DIAGNOSIS — M19011 Primary osteoarthritis, right shoulder: Secondary | ICD-10-CM | POA: Diagnosis not present

## 2024-03-01 ENCOUNTER — Encounter (HOSPITAL_COMMUNITY): Payer: Self-pay

## 2024-03-01 ENCOUNTER — Other Ambulatory Visit: Payer: Self-pay

## 2024-03-01 ENCOUNTER — Emergency Department (HOSPITAL_COMMUNITY)

## 2024-03-01 ENCOUNTER — Emergency Department (HOSPITAL_COMMUNITY)
Admission: EM | Admit: 2024-03-01 | Discharge: 2024-03-01 | Disposition: A | Discharge status: Home / Self Care | Attending: Emergency Medicine | Admitting: Emergency Medicine

## 2024-03-01 DIAGNOSIS — S12291A Other nondisplaced fracture of third cervical vertebra, initial encounter for closed fracture: Secondary | ICD-10-CM | POA: Diagnosis not present

## 2024-03-01 DIAGNOSIS — S0990XA Unspecified injury of head, initial encounter: Secondary | ICD-10-CM | POA: Insufficient documentation

## 2024-03-01 DIAGNOSIS — S7002XA Contusion of left hip, initial encounter: Secondary | ICD-10-CM | POA: Diagnosis not present

## 2024-03-01 DIAGNOSIS — S12200A Unspecified displaced fracture of third cervical vertebra, initial encounter for closed fracture: Secondary | ICD-10-CM | POA: Diagnosis not present

## 2024-03-01 DIAGNOSIS — S12101A Unspecified nondisplaced fracture of second cervical vertebra, initial encounter for closed fracture: Secondary | ICD-10-CM | POA: Diagnosis not present

## 2024-03-01 DIAGNOSIS — M4802 Spinal stenosis, cervical region: Secondary | ICD-10-CM | POA: Diagnosis not present

## 2024-03-01 DIAGNOSIS — I6523 Occlusion and stenosis of bilateral carotid arteries: Secondary | ICD-10-CM | POA: Diagnosis not present

## 2024-03-01 DIAGNOSIS — M47812 Spondylosis without myelopathy or radiculopathy, cervical region: Secondary | ICD-10-CM | POA: Diagnosis not present

## 2024-03-01 DIAGNOSIS — M542 Cervicalgia: Secondary | ICD-10-CM | POA: Diagnosis present

## 2024-03-01 DIAGNOSIS — Y92410 Unspecified street and highway as the place of occurrence of the external cause: Secondary | ICD-10-CM | POA: Diagnosis not present

## 2024-03-01 DIAGNOSIS — S12191A Other nondisplaced fracture of second cervical vertebra, initial encounter for closed fracture: Secondary | ICD-10-CM | POA: Diagnosis not present

## 2024-03-01 DIAGNOSIS — R29818 Other symptoms and signs involving the nervous system: Secondary | ICD-10-CM | POA: Diagnosis not present

## 2024-03-01 LAB — BASIC METABOLIC PANEL WITH GFR
Anion gap: 9 (ref 5–15)
BUN: 21 mg/dL (ref 8–23)
CO2: 28 mmol/L (ref 22–32)
Calcium: 10.1 mg/dL (ref 8.9–10.3)
Chloride: 101 mmol/L (ref 98–111)
Creatinine, Ser: 0.77 mg/dL (ref 0.61–1.24)
GFR, Estimated: >60 mL/min (ref >60)
Glucose, Bld: 115 mg/dL — ABNORMAL HIGH (ref 70–99)
Potassium: 4.1 mmol/L (ref 3.5–5.1)
Sodium: 138 mmol/L (ref 135–145)

## 2024-03-01 LAB — CBC
HCT: 44.9 % (ref 39.0–52.0)
Hemoglobin: 14.8 g/dL (ref 13.0–17.0)
MCH: 34.2 pg — ABNORMAL HIGH (ref 26.0–34.0)
MCHC: 33 g/dL (ref 30.0–36.0)
MCV: 103.7 fL — ABNORMAL HIGH (ref 80.0–100.0)
Platelets: 177 K/uL (ref 150–400)
RBC: 4.33 MIL/uL (ref 4.22–5.81)
RDW: 13 % (ref 11.5–15.5)
WBC: 7.8 K/uL (ref 4.0–10.5)
nRBC: 0 % (ref 0.0–0.2)

## 2024-03-01 MED ORDER — METHOCARBAMOL 500 MG PO TABS
500.0000 mg | ORAL_TABLET | Freq: Two times a day (BID) | ORAL | 0 refills | Status: AC
Start: 2024-03-01 — End: ?

## 2024-03-01 MED ORDER — IOHEXOL 350 MG/ML SOLN
75.0000 mL | Freq: Once | INTRAVENOUS | Status: AC | PRN
  Administered 2024-03-01: 75 mL via INTRAVENOUS

## 2024-03-01 MED ORDER — KETOROLAC TROMETHAMINE 15 MG/ML IJ SOLN
15.0000 mg | Freq: Once | INTRAMUSCULAR | Status: AC
  Administered 2024-03-01: 15 mg via INTRAMUSCULAR
  Filled 2024-03-01: qty 1
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
## 2024-03-01 NOTE — Discharge Instructions (Addendum)
 Please use Tylenol  or ibuprofen for pain.  You may use 600 mg ibuprofen every 6 hours or 1000 mg of Tylenol  every 6 hours.  You may choose to alternate between the 2.  This would be most effective.  Not to exceed 4 g of Tylenol  within 24 hours.  Not to exceed 3200 mg ibuprofen 24 hours.  You can use the muscle relaxant I am prescribing in addition to the above to help with any breakthrough pain.  You can take it up to twice daily.  It is safe to take at night, but I would be cautious taking it during the day as it can cause some drowsiness.  Make sure that you are feeling awake and alert before you get behind the wheel of a car or operate a motor vehicle.  It is not a narcotic pain medication so you are able to take it if it is not making you drowsy and still pilot a vehicle or machinery safely.  Please call the neurosurgeon to schedule as follow up at your earliest convenience.
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
## 2024-03-04 DIAGNOSIS — I7300 Raynaud's syndrome without gangrene: Secondary | ICD-10-CM | POA: Diagnosis not present

## 2024-03-04 DIAGNOSIS — Z681 Body mass index (BMI) 19 or less, adult: Secondary | ICD-10-CM | POA: Diagnosis not present

## 2024-03-04 DIAGNOSIS — R5382 Chronic fatigue, unspecified: Secondary | ICD-10-CM | POA: Diagnosis not present

## 2024-03-04 DIAGNOSIS — M542 Cervicalgia: Secondary | ICD-10-CM | POA: Diagnosis not present

## 2024-03-04 DIAGNOSIS — G629 Polyneuropathy, unspecified: Secondary | ICD-10-CM | POA: Diagnosis not present

## 2024-03-04 DIAGNOSIS — G8929 Other chronic pain: Secondary | ICD-10-CM | POA: Diagnosis not present

## 2024-03-04 DIAGNOSIS — M0609 Rheumatoid arthritis without rheumatoid factor, multiple sites: Secondary | ICD-10-CM | POA: Diagnosis not present

## 2024-03-04 DIAGNOSIS — M25511 Pain in right shoulder: Secondary | ICD-10-CM | POA: Diagnosis not present

## 2024-03-05 ENCOUNTER — Telehealth: Payer: Self-pay | Admitting: Orthopedic Surgery

## 2024-03-05 NOTE — Telephone Encounter (Signed)
 Yes need to have a good recovery on neck before tackling his shoulder issues thanks
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
## 2024-03-12 ENCOUNTER — Ambulatory Visit: Admitting: Orthopedic Surgery

## 2024-03-20 DIAGNOSIS — M0600 Rheumatoid arthritis without rheumatoid factor, unspecified site: Secondary | ICD-10-CM | POA: Diagnosis not present

## 2024-03-20 DIAGNOSIS — M19011 Primary osteoarthritis, right shoulder: Secondary | ICD-10-CM | POA: Diagnosis not present

## 2024-03-27 DIAGNOSIS — S12101A Unspecified nondisplaced fracture of second cervical vertebra, initial encounter for closed fracture: Secondary | ICD-10-CM | POA: Diagnosis not present

## 2024-04-20 DIAGNOSIS — M19011 Primary osteoarthritis, right shoulder: Secondary | ICD-10-CM | POA: Diagnosis not present

## 2024-04-20 DIAGNOSIS — M0600 Rheumatoid arthritis without rheumatoid factor, unspecified site: Secondary | ICD-10-CM | POA: Diagnosis not present

## 2024-05-07 DIAGNOSIS — Z23 Encounter for immunization: Secondary | ICD-10-CM | POA: Diagnosis not present

## 2024-05-07 DIAGNOSIS — R2689 Other abnormalities of gait and mobility: Secondary | ICD-10-CM | POA: Diagnosis not present

## 2024-05-07 DIAGNOSIS — H538 Other visual disturbances: Secondary | ICD-10-CM | POA: Diagnosis not present

## 2024-05-09 DIAGNOSIS — H401131 Primary open-angle glaucoma, bilateral, mild stage: Secondary | ICD-10-CM | POA: Diagnosis not present

## 2024-05-20 DIAGNOSIS — M19011 Primary osteoarthritis, right shoulder: Secondary | ICD-10-CM | POA: Diagnosis not present

## 2024-05-20 DIAGNOSIS — M0600 Rheumatoid arthritis without rheumatoid factor, unspecified site: Secondary | ICD-10-CM | POA: Diagnosis not present

## 2024-06-04 DIAGNOSIS — G629 Polyneuropathy, unspecified: Secondary | ICD-10-CM | POA: Diagnosis not present

## 2024-06-04 DIAGNOSIS — M542 Cervicalgia: Secondary | ICD-10-CM | POA: Diagnosis not present

## 2024-06-04 DIAGNOSIS — I7300 Raynaud's syndrome without gangrene: Secondary | ICD-10-CM | POA: Diagnosis not present

## 2024-06-04 DIAGNOSIS — G8929 Other chronic pain: Secondary | ICD-10-CM | POA: Diagnosis not present

## 2024-06-04 DIAGNOSIS — M25511 Pain in right shoulder: Secondary | ICD-10-CM | POA: Diagnosis not present

## 2024-06-04 DIAGNOSIS — M0609 Rheumatoid arthritis without rheumatoid factor, multiple sites: Secondary | ICD-10-CM | POA: Diagnosis not present

## 2024-06-04 DIAGNOSIS — Z681 Body mass index (BMI) 19 or less, adult: Secondary | ICD-10-CM | POA: Diagnosis not present

## 2024-06-04 DIAGNOSIS — R5382 Chronic fatigue, unspecified: Secondary | ICD-10-CM | POA: Diagnosis not present

## 2024-06-23 ENCOUNTER — Encounter: Payer: Self-pay | Admitting: Radiology

## 2024-09-17 ENCOUNTER — Other Ambulatory Visit

## 2024-09-17 ENCOUNTER — Ambulatory Visit: Admitting: Physician Assistant

## 2024-09-17 ENCOUNTER — Encounter: Payer: Self-pay | Admitting: Physician Assistant

## 2024-09-17 DIAGNOSIS — M25511 Pain in right shoulder: Secondary | ICD-10-CM | POA: Diagnosis not present

## 2024-09-17 MED ORDER — METHYLPREDNISOLONE ACETATE 40 MG/ML IJ SUSP
40.0000 mg | INTRAMUSCULAR | Status: AC | PRN
  Administered 2024-09-17: 40 mg via INTRA_ARTICULAR

## 2024-09-17 MED ORDER — LIDOCAINE HCL 1 % IJ SOLN
5.0000 mL | INTRAMUSCULAR | Status: AC | PRN
  Administered 2024-09-17: 5 mL

## 2024-09-17 NOTE — Progress Notes (Signed)
 "  Office Visit Note   Patient: Danny Bender           Date of Birth: 08-Dec-1955           MRN: [id number] Visit Date: 09/17/2024              Requested by: Regino Slater, MD [address] [place],  [place] [address] PCP: Regino Slater, MD   Assessment & Plan: Visit Diagnoses:  1. Right shoulder pain, unspecified chronicity     Plan: Patient is a 69 year old gentleman who is very active has previously seen Dr. Addie for his right shoulder.  He has a history of a retracted rotator cuff tear, last seen in 2024.  At that time Dr. Addie presented him with the option of a reverse total shoulder replacement.  Since he was very functional and active it was agreed that he would continue to treat this conservatively unfortunately he has been losing function in the right shoulder and acutely woke up with pain in the shoulder 4 to 5 days ago no particular injury.  He did have some radiation with some what he says is electrical feeling in his finger however he does have also a history of neck issues.  Based on exam today I think findings are more consistent with problems in the shoulder he may also have neck problems and I discussed with him he could certainly be seen by Dr. Georgina at some point if this became significant.  He does have degenerative changes of the [facility] joint he has motion but is slow and painful.  Not a lot of pain with external rotation.  Difficult to assess strength as he has pain with resisted abduction and pain limits him on empty can testing and speeds testing based on where his pain is I am going to go forward with a subacromial injection.  He is allergic to prednisone but has tolerated injections in the past.  He is thinking now that it might be time to think about going forward with the reverse shoulder replacement.  I asked that he make a follow-up appointment today with Dr. Addie to discuss this as well as review the results of the injection.  Follow-Up  Instructions: With Dr. Addie to evaluate right shoulder  Orders:  Orders Placed This Encounter  Procedures   XR Shoulder Right   No orders of the defined types were placed in this encounter.     Procedures: Large Joint Inj: R subacromial bursa on 09/17/2024 4:14 PM Indications: diagnostic evaluation and pain Details: 25 G 1.5 in needle, posterior approach  Arthrogram: No  Medications: 5 mL lidocaine  1 %; 40 mg methylPREDNISolone  acetate 40 MG/ML Outcome: tolerated well, no immediate complications Procedure, treatment alternatives, risks and benefits explained, specific risks discussed. Consent was given by the patient.       Clinical Data: No additional findings.   Subjective: No chief complaint on file.   HPI patient is a pleasant 69 year old gentleman who is a patient of Dr. Sedrick.  He has a 4 to 5-day history of waking up 3 to 4 AM and feels like his arm was stuck.  He was seen by Dr. Addie several years ago who recommended a reverse shoulder replacement.  Review of Systems  All other systems reviewed and are negative.    Objective: Vital Signs: There were no vitals taken for this visit.  Physical Exam Constitutional:      Appearance: Normal appearance.  Pulmonary:  Effort: Pulmonary effort is normal.  Skin:    General: Skin is warm and dry.  Neurological:     General: No focal deficit present.     Mental Status: He is alert and oriented to person, place, and time.     Ortho Exam Examination of his right shoulder he has forward elevation to full active forward elevation although this is painful and slow especially when he comes down.  He has decreased internal rotation behind the back which I believe is secondary to pain he is good grip strength his pulses are intact brisk capillary refill.  He has good biceps tricep strength.  He has difficulty performing empty can testing and speeds testing.  No real pain with external rotation of his shoulder no  redness or signs of an infective process Specialty Comments:  No specialty comments available.  Imaging: No results found.   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    Cellulitis 08/20/2017   Status post total replacement of left hip 08/10/2017   Chronic left hip pain 04/24/2017   Unilateral primary osteoarthritis, left hip 04/24/2017   Past Medical History:  Diagnosis Date   Arthritis    shoulders, hands, left wrist (08/22/2017)   Basal cell carcinoma    used nitrogen chin, ?right (08/22/2017)   Chronic back pain    Chronic lower back pain    Complication of anesthesia    took days to clear head   DDD (degenerative disc disease), cervical    DDD (degenerative disc disease), lumbar    Discoloration of skin    fingers and toe    Family history of adverse reaction to anesthesia    sister had a problem; don't remember what it was   Gout    History of shingles    Nasal polyps    OA (osteoarthritis) of hip    Ruptured disk    lumbar    History reviewed. No pertinent family history.  Past Surgical History:  Procedure Laterality Date   ABDOMINAL HERNIA REPAIR  2004   ANTERIOR CERVICAL DECOMP/DISCECTOMY FUSION  1991   HERNIA REPAIR     INGUINAL HERNIA REPAIR Right 11/04/2020   Procedure: LAPAROSCOPIC REPAIR RIGHT INGUINAL HERNIA WITH MESH, EXCISION OF PERITONEAL MASS, AND LEFT FEMORAL HERNIA ;  Surgeon: Sheldon Standing, MD;  Location: [facility];  Service: General;  Laterality: Right;  ROOM 4 STARTING AT 07:30AM FOR 60 MIN   JOINT REPLACEMENT     TOTAL HIP ARTHROPLASTY Left 08/10/2017   Procedure: LEFT TOTAL HIP ARTHROPLASTY ANTERIOR APPROACH;  Surgeon: Vernetta Lonni GRADE, MD;  Location: [facility];  Service: Orthopedics;  Laterality: Left;   Social History   Occupational History   Not on file  Tobacco Use   Smoking  status: Former    Current packs/day: 0.00    Average packs/day: 2.0 packs/day for 10.0 years (20.0 ttl pk-yrs)    Types: Cigarettes    Start date: 08/21/1992    Quit date: 08/21/2002    Years since quitting: 22.0   Smokeless tobacco: Never  Vaping Use   Vaping status: Never Used  Substance and Sexual Activity   Alcohol use: Not Currently    Alcohol/week: 7.0 standard drinks of alcohol    Types: 7 Standard drinks or equivalent per week   Drug use: Yes    Types: Marijuana  Comment: 08/22/2017 just started recently; to help w/pain   Sexual activity: Not Currently        "

## 2024-10-06 ENCOUNTER — Ambulatory Visit: Admitting: Orthopedic Surgery
